# Patient Record
Sex: Male | Born: 1955 | Race: White | Hispanic: No | Smoking: Never smoker
Health system: Southern US, Community
[De-identification: ages and names within clinical notes are randomized; demographics above are authoritative.]

## PROBLEM LIST (undated history)

## (undated) ENCOUNTER — Ambulatory Visit: Discharge status: Absent without leave | Payer: BC Managed Care – PPO

## (undated) DIAGNOSIS — K219 Gastro-esophageal reflux disease without esophagitis: Secondary | ICD-10-CM

## (undated) DIAGNOSIS — E785 Hyperlipidemia, unspecified: Secondary | ICD-10-CM

## (undated) DIAGNOSIS — E119 Type 2 diabetes mellitus without complications: Secondary | ICD-10-CM

## (undated) DIAGNOSIS — I10 Essential (primary) hypertension: Secondary | ICD-10-CM

## (undated) DIAGNOSIS — R7303 Prediabetes: Secondary | ICD-10-CM

## (undated) DIAGNOSIS — D6851 Activated protein C resistance: Secondary | ICD-10-CM

## (undated) DIAGNOSIS — C801 Malignant (primary) neoplasm, unspecified: Secondary | ICD-10-CM

## (undated) DIAGNOSIS — M1990 Unspecified osteoarthritis, unspecified site: Secondary | ICD-10-CM

## (undated) DIAGNOSIS — G4733 Obstructive sleep apnea (adult) (pediatric): Secondary | ICD-10-CM

## (undated) DIAGNOSIS — G5600 Carpal tunnel syndrome, unspecified upper limb: Secondary | ICD-10-CM

## (undated) DIAGNOSIS — Z87442 Personal history of urinary calculi: Secondary | ICD-10-CM

## (undated) HISTORY — DX: Obstructive sleep apnea (adult) (pediatric): G47.33

## (undated) HISTORY — DX: Type 2 diabetes mellitus without complications: E11.9

## (undated) HISTORY — DX: Hyperlipidemia, unspecified: E78.5

## (undated) HISTORY — PX: HERNIA REPAIR: SHX51

## (undated) HISTORY — DX: Essential (primary) hypertension: I10

---

## 1994-09-27 HISTORY — PX: ANKLE SURGERY: SHX546

## 1997-10-07 ENCOUNTER — Encounter: Payer: Self-pay | Admitting: Pulmonary Disease

## 1997-11-12 ENCOUNTER — Encounter: Payer: Self-pay | Admitting: Pulmonary Disease

## 1997-12-26 ENCOUNTER — Encounter: Payer: Self-pay | Admitting: Pulmonary Disease

## 1998-01-27 ENCOUNTER — Encounter: Payer: Self-pay | Admitting: Pulmonary Disease

## 1998-01-27 ENCOUNTER — Ambulatory Visit: Admission: RE | Admit: 1998-01-27 | Discharge: 1998-01-27 | Payer: Self-pay | Admitting: Pulmonary Disease

## 2001-04-10 ENCOUNTER — Other Ambulatory Visit: Admission: RE | Admit: 2001-04-10 | Discharge: 2001-04-10 | Payer: Self-pay | Admitting: Dermatology

## 2004-02-19 ENCOUNTER — Ambulatory Visit (HOSPITAL_COMMUNITY): Admission: RE | Admit: 2004-02-19 | Discharge: 2004-02-19 | Payer: Self-pay | Admitting: General Surgery

## 2010-02-20 ENCOUNTER — Encounter: Payer: Self-pay | Admitting: Gastroenterology

## 2010-02-25 HISTORY — PX: COLONOSCOPY: SHX174

## 2010-03-06 ENCOUNTER — Ambulatory Visit (HOSPITAL_COMMUNITY): Admission: RE | Admit: 2010-03-06 | Discharge: 2010-03-06 | Payer: Self-pay | Admitting: Gastroenterology

## 2010-03-06 ENCOUNTER — Ambulatory Visit: Payer: Self-pay | Admitting: Gastroenterology

## 2010-04-22 ENCOUNTER — Ambulatory Visit: Payer: Self-pay | Admitting: Pulmonary Disease

## 2010-04-22 DIAGNOSIS — I10 Essential (primary) hypertension: Secondary | ICD-10-CM | POA: Insufficient documentation

## 2010-04-22 DIAGNOSIS — G4733 Obstructive sleep apnea (adult) (pediatric): Secondary | ICD-10-CM | POA: Insufficient documentation

## 2010-04-22 DIAGNOSIS — Z9989 Dependence on other enabling machines and devices: Secondary | ICD-10-CM

## 2010-04-22 DIAGNOSIS — E119 Type 2 diabetes mellitus without complications: Secondary | ICD-10-CM

## 2010-05-09 ENCOUNTER — Encounter: Payer: Self-pay | Admitting: Pulmonary Disease

## 2010-10-27 NOTE — Letter (Signed)
Summary: Internal Other Domingo Dimes  Internal Other Domingo Dimes   Imported By: Cloria Spring LPN 16/06/9603 54:09:81  _____________________________________________________________________  External Attachment:    Type:   Image     Comment:   External Document

## 2010-10-27 NOTE — Letter (Signed)
Summary: [facility]   Imported By: Sherian Rein 04/23/2010 07:24:31  _____________________________________________________________________  External Attachment:    Type:   Image     Comment:   External Document

## 2010-10-27 NOTE — Assessment & Plan Note (Signed)
Summary: consult for management of osa   Copy to:  Carylon Perches Primary Provider/Referring Provider:  Carylon Perches  CC:  Sleep Consult.  Former Pt of Dr. Shelle Iron.  Last seen 1999.Marland Kitchen  History of Present Illness: The pt is a 55y/o male who I have been asked to see for management of osa.  He was diagnosed with severe osa in 1999, with AHI of 73/hr and optimal cpap pressure of 11cm.  He was started on cpap successfully, but lost to f/u since 1999.  He has stayed on cpap since that time, and feels he is doing well with the device.  He is using the same machine, which is overdue for replacement, and his current mask is at least 55 yrs old.  He goes to bed at 10-11pm, and arises at 5:30am to start his day.  He feels rested upon arising.  He denies any breakthru snoring or apnea.  He has no daytime alertness issues, and no sleepiness with driving.  His weight is up 12 pounds since his last sleep study, and his epworth score is 8.  Preventive Screening-Counseling & Management  Alcohol-Tobacco     Smoking Status: never  Current Medications (verified): 1)  Pravastatin Sodium 20 Mg Tabs (Pravastatin Sodium) .... Take 1 Tablet By Mouth Once A Day 2)  Amlodipine Besy-Benazepril Hcl 5-20 Mg Caps (Amlodipine Besy-Benazepril Hcl) .... Take 1 Tablet By Mouth Once A Day 3)  Fish Oil .... Take 1 Tablet By Mouth Once A Day 4)  Aspirin Low Dose 81 Mg Tabs (Aspirin) .... Take 1 Tablet By Mouth Once A Day  Allergies (verified): No Known Drug Allergies  Past History:  Past Medical History:  DIABETES MELLITUS (ICD-250.00) OBSTRUCTIVE SLEEP APNEA (ICD-327.23)--AHI 73/hr in 1999 HYPERTENSION (ICD-401.9)    Past Surgical History: L ankle surgery  Family History: Reviewed history and no changes required. heart disease: mother clotting disorder: father rheumatism: sister, mother   Social History: Reviewed history and no changes required. Patient never smoked.  pt is married and lives with wife, Ethridge Sollenberger. Pt has children. pt works as a Academic librarian. Smoking Status:  never  Review of Systems  The patient denies shortness of breath with activity, shortness of breath at rest, productive cough, non-productive cough, coughing up blood, chest pain, irregular heartbeats, acid heartburn, indigestion, loss of appetite, weight change, abdominal pain, difficulty swallowing, sore throat, tooth/dental problems, headaches, nasal congestion/difficulty breathing through nose, sneezing, itching, ear ache, anxiety, depression, hand/feet swelling, joint stiffness or pain, rash, change in color of mucus, and fever.    Vital Signs:  Patient profile:   55 year old male Height:      69.5 inches Weight:      228.25 pounds O2 Sat:      97 % on Room air Temp:     98.1 degrees F oral Pulse rate:   68 / minute BP sitting:   122 / 62  (left arm) Cuff size:   regular  Vitals Entered By: Arman Filter LPN (April 22, 2010 9:24 AM)  O2 Flow:  Room air CC: Sleep Consult.  Former Pt of Dr. Shelle Iron.  Last seen 1999. Comments Medications reviewed with patient Arman Filter LPN  April 22, 2010 9:24 AM    Physical Exam  General:  ow male in nad Eyes:  PERRLA and EOMI.   Nose:  mild turbinate hypertrophy, but patent bilat Mouth:  significant elongation of soft palate and uvula Neck:  no jvd, tmg, LN Lungs:  clear to  auscultation, no wheezing or rhonchi Heart:  rrr, no mrg Abdomen:  soft and nontender, bs+ Extremities:  mild ankle edema, no cyanosis pulses intact distally Neurologic:  alert and oriented, does not appear sleepy, moves all 4.   Impression & Recommendations:  Problem # 1:   OBSTRUCTIVE SLEEP APNEA (ICD-327.23) the pt has a h/o severe osa, and has actually gained weight since his last sleep study.  He has remained compliant with cpap, and feels that he is doing well with his sleep and daytime alertness.  He is overdue for a new mask and cpap machine, and will arrange for replacement.   I have encouraged him to work aggressively on weight loss, and to followup with me in one year.  If he feels his symptoms are not being adequately controlled, wiould do an autotitration for 2 weeks to recalibrate his pressure.  Medications Added to Medication List This Visit: 1)  Pravastatin Sodium 20 Mg Tabs (Pravastatin sodium) .... Take 1 tablet by mouth once a day 2)  Amlodipine Besy-benazepril Hcl 5-20 Mg Caps (Amlodipine besy-benazepril hcl) .... Take 1 tablet by mouth once a day 3)  Fish Oil  .... Take 1 tablet by mouth once a day 4)  Aspirin Low Dose 81 Mg Tabs (Aspirin) .... Take 1 tablet by mouth once a day  Other Orders: Consultation Level IV (47829) DME Referral (DME)  Patient Instructions: 1)  will get you a new machine, mask, hoses 2)  work on weight loss 3)  followup with me in one year.

## 2010-10-27 NOTE — Letter (Signed)
Summary: [facility]  [facility]   Imported By: Lester Campo [place]ito 05/13/2010 10:21:01  _____________________________________________________________________  External Attachment:    Type:   Image     Comment:   External Document

## 2010-10-27 NOTE — Consult Note (Signed)
Summary: [facility]   Imported By: Sherian Rein 04/23/2010 07:23:01  _____________________________________________________________________  External Attachment:    Type:   Image     Comment:   External Document

## 2011-02-12 NOTE — H&P (Signed)
NAME:  Carl [name], Carl [name] NO.:  [id number]   MEDICAL RECORD NO.:  [id number]                  PATIENT TYPE:   LOCATION:                                       FACILITY:   PHYSICIAN:  Dalia Heading, M.D.               DATE OF BIRTH:  1956-02-22   DATE OF ADMISSION:  DATE OF DISCHARGE:                                HISTORY & PHYSICAL   CHIEF COMPLAINT:  Mass, right flank.   HISTORY OF PRESENT ILLNESS:  The patient is a 55 year old white male who was  referred for evaluation and treatment of a tender mass along his right  flank. It has been present for some time, but it is increasing in size and  causing discomfort. Pain is made worse with straining. No drainage has been  noted.   PAST MEDICAL HISTORY:  Hypertension.   PAST SURGICAL HISTORY:  Ankle surgery.   CURRENT MEDICATIONS:  Lotrel one tablet p.o. q.d.   ALLERGIES:  No known drug allergies.   REVIEW OF SYSTEMS:  Noncontributory.   PHYSICAL EXAMINATION:  GENERAL:  The patient is a well-developed, well-  nourished, white male in no acute distress. He is afebrile. Vital signs are  stable.  LUNGS:  Clear to auscultation with equal breath sounds bilaterally.  HEART:  Regular rate and rhythm without S3, S4, murmurs.  ABDOMEN:  Right flank examination reveals a right posterior flank 2 to 3 cm  ovoid mass over the costal margin. It is subcutaneous in nature. It is  mobile.   IMPRESSION:  Mass, right flank.   PLAN:  The patient is scheduled for excision of the mass, right flank on Feb 19, 2004. The risks and benefits of the procedure including bleeding  infection and recurrence of the mass were fully explained to the patient who  gave informed consent.     ___________________________________________                                         Dalia Heading, M.D.   MAJ/MEDQ  D:  02/06/2004  T:  02/06/2004  Job:  [id number]   cc:   Kingsley Callander. Ouida Sills, M.D.  [address]  [place]  [place]  [address]  Fax: [phone]

## 2011-05-10 ENCOUNTER — Encounter: Payer: Self-pay | Admitting: Pulmonary Disease

## 2011-05-11 ENCOUNTER — Encounter: Payer: Self-pay | Admitting: Pulmonary Disease

## 2011-05-11 ENCOUNTER — Ambulatory Visit (INDEPENDENT_AMBULATORY_CARE_PROVIDER_SITE_OTHER): Payer: BC Managed Care – PPO | Admitting: Pulmonary Disease

## 2011-05-11 VITALS — BP 126/70 | HR 63 | Temp 98.1°F | Ht 69.0 in | Wt 232.0 lb

## 2011-05-11 DIAGNOSIS — G4733 Obstructive sleep apnea (adult) (pediatric): Secondary | ICD-10-CM

## 2011-05-11 NOTE — Progress Notes (Signed)
  Subjective:    Patient ID: Carl [name], male    DOB: 05-28-1956, 55 y.o.   MRN: [id number]  HPI The patient comes in today for followup of his known sleep apnea.  He is wearing CPAP compliantly, and is having no issues with mask fit or pressure.  He is having difficulties with his current mask because it is old, and due for replacement.  He feels the CPAP is working quite well for him, with restorative sleep and excellent daytime alertness.  His wife denies any breakthrough snoring.   Review of Systems  Constitutional: Negative for fever and unexpected weight change.  HENT: Negative for ear pain, nosebleeds, congestion, sore throat, rhinorrhea, sneezing, trouble swallowing, dental problem, postnasal drip and sinus pressure.   Eyes: Negative for redness and itching.  Respiratory: Negative for cough, chest tightness, shortness of breath and wheezing.   Cardiovascular: Positive for leg swelling. Negative for palpitations.  Gastrointestinal: Negative for nausea and vomiting.  Genitourinary: Negative for dysuria.  Musculoskeletal: Negative for joint swelling.  Skin: Negative for rash.  Neurological: Negative for headaches.  Hematological: Does not bruise/bleed easily.  Psychiatric/Behavioral: Negative for dysphoric mood. The patient is not nervous/anxious.        Objective:   Physical Exam Overweight male in no acute distress No skin breakdown or pressure necrosis from CPAP mask Nose without purulence or discharge noted Lower extremities without edema, pulses intact distally Alert, does not appear sleepy, moves all 4 extremities.       Assessment & Plan:

## 2011-05-11 NOTE — Assessment & Plan Note (Signed)
The patient is doing well with his CPAP, with control of his symptomatology.  He does need a new CPAP mask, and we'll send an order to his durable medical equipment company for this.  I have asked him to continue on CPAP, keep up with supplies, and work aggressively on weight loss.  I will see him back in one year for a check.

## 2011-05-11 NOTE — Patient Instructions (Signed)
Will send an order to your dme to get you new mask Work on weight loss followup with me in one year.

## 2012-02-19 ENCOUNTER — Emergency Department (HOSPITAL_COMMUNITY)
Admission: EM | Admit: 2012-02-19 | Discharge: 2012-02-19 | Disposition: A | Discharge status: Home / Self Care | Payer: BC Managed Care – PPO | Attending: Emergency Medicine | Admitting: Emergency Medicine

## 2012-02-19 ENCOUNTER — Encounter (HOSPITAL_COMMUNITY): Payer: Self-pay | Admitting: *Deleted

## 2012-02-19 DIAGNOSIS — M7989 Other specified soft tissue disorders: Secondary | ICD-10-CM | POA: Insufficient documentation

## 2012-02-19 DIAGNOSIS — H9319 Tinnitus, unspecified ear: Secondary | ICD-10-CM | POA: Insufficient documentation

## 2012-02-19 DIAGNOSIS — M79643 Pain in unspecified hand: Secondary | ICD-10-CM

## 2012-02-19 DIAGNOSIS — E119 Type 2 diabetes mellitus without complications: Secondary | ICD-10-CM | POA: Insufficient documentation

## 2012-02-19 DIAGNOSIS — I10 Essential (primary) hypertension: Secondary | ICD-10-CM | POA: Insufficient documentation

## 2012-02-19 DIAGNOSIS — M79609 Pain in unspecified limb: Secondary | ICD-10-CM | POA: Insufficient documentation

## 2012-02-19 LAB — CBC
HCT: 38.1 % — ABNORMAL LOW (ref 39.0–52.0)
Hemoglobin: 13.1 g/dL (ref 13.0–17.0)
MCH: 28.1 pg (ref 26.0–34.0)
MCV: 81.8 fL (ref 78.0–100.0)
RBC: 4.66 MIL/uL (ref 4.22–5.81)

## 2012-02-19 LAB — SEDIMENTATION RATE: Sed Rate: 17 mm/hr — ABNORMAL HIGH (ref 0–16)

## 2012-02-19 LAB — COMPREHENSIVE METABOLIC PANEL
Alkaline Phosphatase: 69 U/L (ref 39–117)
BUN: 8 mg/dL (ref 6–23)
CO2: 25 mEq/L (ref 19–32)
Calcium: 8.9 mg/dL (ref 8.4–10.5)
GFR calc Af Amer: >90 mL/min (ref >90)
GFR calc non Af Amer: >90 mL/min (ref >90)
Glucose, Bld: 118 mg/dL — ABNORMAL HIGH (ref 70–99)
Total Protein: 6.9 g/dL (ref 6.0–8.3)

## 2012-02-19 LAB — DIFFERENTIAL
Eosinophils Absolute: 0.1 10*3/uL (ref 0.0–0.7)
Eosinophils Relative: 1 % (ref 0–5)
Lymphs Abs: 2.3 10*3/uL (ref 0.7–4.0)
Monocytes Relative: 9 % (ref 3–12)

## 2012-02-19 MED ORDER — IBUPROFEN 800 MG PO TABS
800.0000 mg | ORAL_TABLET | Freq: Once | ORAL | Status: AC
  Administered 2012-02-19: 800 mg via ORAL
  Filled 2012-02-19: qty 1

## 2012-02-19 MED ORDER — NAPROXEN 500 MG PO TABS: 500.0000 mg | ORAL_TABLET | Freq: Two times a day (BID) | ORAL | Status: AC

## 2012-02-19 NOTE — ED Provider Notes (Signed)
History  Scribed for Carl Lennert, MD, the patient was seen in room APA17/APA17. This chart was scribed by Carl [name]. The patient's care started at 10:44 AM    CSN: [id number]  Arrival date & time 02/19/12  5638   First MD Initiated Contact with Patient 02/19/12 1034      Chief Complaint  Patient presents with  . Tinnitus    Patient is a 56 y.o. male presenting with plugged ear sensation.  Ear Fullness This is a recurrent problem. The current episode started yesterday. The problem occurs constantly. The problem has not changed since onset.Pertinent negatives include no abdominal pain and no shortness of breath. The symptoms are aggravated by nothing. The symptoms are relieved by nothing. Treatments tried: prescribed augmentin. The treatment provided mild relief.   Carl [name] is a 56 y.o. male who presents to the Emergency Department complaining of bilateral tinnitus and unsteady gait that started yesterday.  Pt was seen by Dr. Ouida [name] five days ago with a sinus infection and was prescribed Augmentin.  He states that he was getting better and started feeling worse yesterday experiencing weakness and swelling in his hands bilaterally.  Pt reports that he has been bitten by a tick about one week ago.   Past Medical History  Diagnosis Date  . Diabetes mellitus   . OSA (obstructive sleep apnea)   . Hypertension     Past Surgical History  Procedure Date  . Ankle surgery     Left    Family History  Problem Relation Age of Onset  . Heart disease Mother   . Clotting disorder Father   . Rheum arthritis Sister   . Rheum arthritis Mother     History  Substance Use Topics  . Smoking status: Never Smoker   . Smokeless tobacco: Not on file  . Alcohol Use: No      Review of Systems  HENT: Positive for tinnitus.   Respiratory: Negative for shortness of breath.   Gastrointestinal: Negative for abdominal pain.  Musculoskeletal: Positive for joint swelling.    Neurological: Positive for weakness.  All other systems reviewed and are negative.    Allergies  Review of patient's allergies indicates no known allergies.  Home Medications   Current Outpatient Rx  Name Route Sig Dispense Refill  . AMLODIPINE BESY-BENAZEPRIL HCL 5-20 MG PO CAPS Oral Take 1 capsule by mouth 2 (two) times daily.     . ASPIRIN 81 MG PO TABS Oral Take 81 mg by mouth daily.      . OMEGA-3 FATTY ACIDS 1000 MG PO CAPS Oral Take 1 capsule by mouth daily.      Marland Kitchen PRAVASTATIN SODIUM 20 MG PO TABS Oral Take 20 mg by mouth daily.        BP 140/76  Pulse 81  Temp(Src) 99.7 F (37.6 C) (Oral)  Resp 20  Ht 5\' 9"  (1.753 m)  Wt 220 lb (99.791 kg)  BMI 32.49 kg/m2  SpO2 97%  Physical Exam  Nursing note and vitals reviewed. Constitutional: He is oriented to person, place, and time. He appears well-developed and well-nourished. No distress.  HENT:  Head: Normocephalic and atraumatic.  Eyes: EOM are normal. Pupils are equal, round, and reactive to light.  Neck: Neck supple. No tracheal deviation present.  Cardiovascular: Normal rate.   Pulmonary/Chest: Effort normal. No respiratory distress.  Abdominal: Soft. He exhibits no distension.  Musculoskeletal: Normal range of motion. He exhibits no edema.       Tenderness  and inflammation in the right first and second MCP joints.    Neurological: He is alert and oriented to person, place, and time. No sensory deficit.  Skin: Skin is warm and dry.  Psychiatric: He has a normal mood and affect. His behavior is normal.    ED Course  Procedures  DIAGNOSTIC STUDIES: Oxygen Saturation is 97% on room air, normal by my interpretation.    COORDINATION OF CARE:  10:51AM Ordered: CBC ; Differential ; Comprehensive metabolic panel ; Sedimentation rate ; B. burgdorfi antibodies   Labs Reviewed  CBC - Abnormal; Notable for the following:    WBC 11.4 (*)    HCT 38.1 (*)    All other components within normal limits  DIFFERENTIAL  - Abnormal; Notable for the following:    Neutro Abs 7.9 (*)    All other components within normal limits  COMPREHENSIVE METABOLIC PANEL - Abnormal; Notable for the following:    Sodium 134 (*)    Glucose, Bld 118 (*)    All other components within normal limits  SEDIMENTATION RATE - Abnormal; Notable for the following:    Sed Rate 17 (*)    All other components within normal limits  B. BURGDORFI ANTIBODIES   No results found.   No diagnosis found.    MDM   The chart was scribed for me under my direct supervision.  I personally performed the history, physical, and medical decision making and all procedures in the evaluation of this patient.Carl Lennert, MD 02/22/12 [phone]

## 2012-02-19 NOTE — ED Notes (Signed)
Family at bedside. Family asked about if the results were ready. Patient and family member were told 1 result is still pending the Doctor would be in as soon as the results were available.

## 2012-02-19 NOTE — Discharge Instructions (Signed)
Follow up with your md this week. °

## 2012-02-19 NOTE — ED Notes (Addendum)
Pt c/o aching all over, bilateral ringing in ears and unsteady gait since yesterday. Seen on Monday by Dr. Ouida Sills and diagnosed with an earache and sinus infection. Pt states that he started feeling better and then got worse again yesterday. Pt has weak hand grips and swelling to bilateral index fingers and thumbs. Bitten by a tick 1 week ago and had multiple other tick bites.

## 2012-02-19 NOTE — ED Notes (Signed)
Family at bedside. Patient states he does not need anything at this time. 

## 2014-03-13 NOTE — H&P (Signed)
  NTS SOAP Note  Vital Signs:  Vitals as of: 0/97/3532: Systolic 992: Diastolic 96: Heart Rate 71: Temp 98.40F: Height 29ft 9in: Weight 234Lbs 0 Ounces: BMI 34.56  BMI : 34.56 kg/m2  Subjective: This 58 Years 71 Months old Male presents for of an umbilical hernia. He has been present for some time but is increasing in size and causing discomfort. Made worse with straining.  Review of Symptoms:  Constitutional:unremarkable   Head:unremarkable    Eyes:unremarkable   Nose/Mouth/Throat:unremarkable Cardiovascular:  unremarkable   Respiratory:  cough Gastrointestinal:  unremarkable   Genitourinary:unremarkable       back Skin:unremarkable Hematolgic/Lymphatic:unremarkable     Allergic/Immunologic:unremarkable     Past Medical History:    Reviewed  Past Medical History  Surgical History: ankle surgery, kidney surgery Medical Problems: HTN, high cholesterol Allergies: amoxicillin Medications: amlodipine/benazepril, pravastatin, aspirin   Social History:Reviewed  Social History  Preferred Language: English Race:  White Ethnicity: Not Hispanic / Latino Age: 58 Years 10 Months Marital Status:  M Alcohol: no Recreational drug(s): no   Smoking Status: Never smoker reviewed on 02/22/2014 Functional Status reviewed on 02/22/2014 ------------------------------------------------ Bathing: Normal Cooking: Normal Dressing: Normal Driving: Normal Eating: Normal Managing Meds: Normal Oral Care: Normal Shopping: Normal Toileting: Normal Transferring: Normal Walking: Normal Cognitive Status reviewed on 02/22/2014 ------------------------------------------------ Attention: Normal Decision Making: Normal Language: Normal Memory: Normal Motor: Normal Perception: Normal Problem Solving: Normal Visual and Spatial: Normal   Family History:  Reviewed  Family Health History Mother, Living; Heart disease;  Father, Deceased;  Healthy;     Objective Information: General:  Well appearing, well nourished in no distress. Heart:  RRR, no murmur or gallop.  Normal S1, S2.  No S3, S4.  Lungs:    CTA bilaterally, no wheezes, rhonchi, rales.  Breathing unlabored. Abdomen:Soft, NT/ND, no HSM, no masses.large reducible umbilical hernia.  Assessment:umbilical hernia  Diagnoses: 426.8 Umbilical hernia (Umbilical hernia without obstruction or gangrene)  Procedures: 34196 - OFFICE OUTPATIENT NEW 30 MINUTES    Plan:scheduled for umbilical herniorrhaphy with mesh on 04/10/2014.    Patient Education:Alternative treatments to surgery were discussed with patient (and family).  Risks and benefits  of procedure including bleeding, infection, mesh use, and a possibility of recurrence of the hernia were fully explained to the patient (and family) who gave informed consent. Patient/family questions were addressed.  Follow-up:Pending Surgery

## 2014-04-01 ENCOUNTER — Encounter (HOSPITAL_COMMUNITY): Payer: Self-pay | Admitting: Pharmacy Technician

## 2014-04-03 NOTE — Patient Instructions (Signed)
Alaster Asfaw Vermeer  04/03/2014   Your procedure is scheduled on:   04/10/2014  Report to [facility] at  110  AM.  Call this number if you have problems the morning of surgery: [phone]   Remember:   Do not eat food or drink liquids after midnight.   Take these medicines the morning of surgery with A SIP OF WATER: amlodipine   Do not wear jewelry, make-up or nail polish.  Do not wear lotions, powders, or perfumes.   Do not shave 48 hours prior to surgery. Men may shave face and neck.  Do not bring valuables to the hospital.  [facility] is not responsible for any belongings or valuables.               Contacts, dentures or bridgework may not be worn into surgery.  Leave suitcase in the car. After surgery it may be brought to your room.  For patients admitted to the hospital, discharge time is determined by your treatment team.               Patients discharged the day of surgery will not be allowed to drive home.  Name and phone number of your driver: family  Special Instructions: Shower using CHG 2 nights before surgery and the night before surgery.  If you shower the day of surgery use CHG.  Use special wash - you have one bottle of CHG for all showers.  You should use approximately 1/3 of the bottle for each shower.   Please read over the following fact sheets that you were given: Pain Booklet, Coughing and Deep Breathing, Surgical Site Infection Prevention, Anesthesia Post-op Instructions and Care and Recovery After Surgery Hernia A hernia happens when an organ inside your body pushes out through a weak spot in your belly (abdominal) wall. Most hernias get worse over time. They can often be pushed back into place (reduced). Surgery may be needed to repair hernias that cannot be pushed into place. HOME CARE  Keep doing normal activities.  Avoid lifting more than 10 pounds (4.5 kilograms).  Cough gently and avoid straining. Over time, these things will:  Increase your  hernia size.  Irritate your hernia.  Break down hernia repairs.  Stop smoking.  Do not wear anything tight over your hernia. Do not keep the hernia in with an outside bandage.  Eat food that is high in fiber (fruit, vegetables, whole grains).  Drink enough fluids to keep your pee (urine) clear or pale yellow.  Take medicines to make your poop soft (stool softeners) if you cannot poop (constipated). GET HELP RIGHT AWAY IF:   You have a fever.  You have belly pain that gets worse.  You feel sick to your stomach (nauseous) and throw up (vomit).  Your skin starts to bulge out.  Your hernia turns a different color, feels hard, or is tender.  You have increased pain or puffiness (swelling) around the hernia.  You poop more or less often.  Your poop does not look the way normally does.  You have watery poop (diarrhea).  You cannot push the hernia back in place by applying gentle pressure while lying down. MAKE SURE YOU:   Understand these instructions.  Will watch your condition.  Will get help right away if you are not doing well or get worse. Document Released: 03/03/2010 Document Revised: 12/06/2011 Document Reviewed: 03/03/2010 [facility] Patient Information 2015 [place], [place]. This information is not intended to replace  advice given to you by your health care provider. Make sure you discuss any questions you have with your health care provider. PATIENT INSTRUCTIONS POST-ANESTHESIA  IMMEDIATELY FOLLOWING SURGERY:  Do not drive or operate machinery for the first twenty four hours after surgery.  Do not make any important decisions for twenty four hours after surgery or while taking narcotic pain medications or sedatives.  If you develop intractable nausea and vomiting or a severe headache please notify your doctor immediately.  FOLLOW-UP:  Please make an appointment with your surgeon as instructed. You do not need to follow up with anesthesia unless specifically  instructed to do so.  WOUND CARE INSTRUCTIONS (if applicable):  Keep a dry clean dressing on the anesthesia/puncture wound site if there is drainage.  Once the wound has quit draining you may leave it open to air.  Generally you should leave the bandage intact for twenty four hours unless there is drainage.  If the epidural site drains for more than 36-48 hours please call the anesthesia department.  QUESTIONS?:  Please feel free to call your physician or the hospital operator if you have any questions, and they will be happy to assist you.

## 2014-04-04 ENCOUNTER — Other Ambulatory Visit: Payer: Self-pay

## 2014-04-04 ENCOUNTER — Encounter (HOSPITAL_COMMUNITY)
Admission: RE | Admit: 2014-04-04 | Discharge: 2014-04-04 | Disposition: A | Discharge status: Home / Self Care | Payer: BC Managed Care – PPO | Source: Ambulatory Visit | Attending: General Surgery | Admitting: General Surgery

## 2014-04-04 ENCOUNTER — Encounter (HOSPITAL_COMMUNITY): Payer: Self-pay

## 2014-04-04 DIAGNOSIS — Z01812 Encounter for preprocedural laboratory examination: Secondary | ICD-10-CM | POA: Insufficient documentation

## 2014-04-04 HISTORY — DX: Activated protein C resistance: D68.51

## 2014-04-04 LAB — CBC WITH DIFFERENTIAL/PLATELET
Basophils Absolute: 0 10*3/uL (ref 0.0–0.1)
Basophils Relative: 0 % (ref 0–1)
Eosinophils Absolute: 0.2 10*3/uL (ref 0.0–0.7)
Eosinophils Relative: 4 % (ref 0–5)
HEMATOCRIT: 39.4 % (ref 39.0–52.0)
HEMOGLOBIN: 13.4 g/dL (ref 13.0–17.0)
LYMPHS PCT: 41 % (ref 12–46)
Lymphs Abs: 2.2 10*3/uL (ref 0.7–4.0)
MCH: 28.6 pg (ref 26.0–34.0)
MCHC: 34 g/dL (ref 30.0–36.0)
MCV: 84.2 fL (ref 78.0–100.0)
MONO ABS: 0.7 10*3/uL (ref 0.1–1.0)
MONOS PCT: 13 % — AB (ref 3–12)
NEUTROS ABS: 2.2 10*3/uL (ref 1.7–7.7)
Neutrophils Relative %: 42 % — ABNORMAL LOW (ref 43–77)
Platelets: 222 10*3/uL (ref 150–400)
RBC: 4.68 MIL/uL (ref 4.22–5.81)
RDW: 12.8 % (ref 11.5–15.5)
WBC: 5.3 10*3/uL (ref 4.0–10.5)

## 2014-04-04 LAB — BASIC METABOLIC PANEL
ANION GAP: 13 (ref 5–15)
BUN: 9 mg/dL (ref 6–23)
CHLORIDE: 102 meq/L (ref 96–112)
CO2: 24 meq/L (ref 19–32)
CREATININE: 0.57 mg/dL (ref 0.50–1.35)
Calcium: 8.8 mg/dL (ref 8.4–10.5)
GFR calc Af Amer: >90 mL/min (ref >90)
GFR calc non Af Amer: >90 mL/min (ref >90)
GLUCOSE: 133 mg/dL — AB (ref 70–99)
Potassium: 4 mEq/L (ref 3.7–5.3)
Sodium: 139 mEq/L (ref 137–147)

## 2014-04-04 NOTE — Pre-Procedure Instructions (Signed)
Patient given instructions with web site and code to sign up for "My Chart"  

## 2014-04-09 NOTE — OR Nursing (Signed)
Dr. Patsey Berthold notified of abnormal EKG and medical history of Factor 5

## 2014-04-10 ENCOUNTER — Ambulatory Visit (HOSPITAL_COMMUNITY): Payer: BC Managed Care – PPO | Admitting: Anesthesiology

## 2014-04-10 ENCOUNTER — Encounter (HOSPITAL_COMMUNITY): Payer: BC Managed Care – PPO | Admitting: Anesthesiology

## 2014-04-10 ENCOUNTER — Ambulatory Visit (HOSPITAL_COMMUNITY)
Admission: RE | Admit: 2014-04-10 | Discharge: 2014-04-10 | Disposition: A | Discharge status: Home / Self Care | Payer: BC Managed Care – PPO | Source: Ambulatory Visit | Attending: General Surgery | Admitting: General Surgery

## 2014-04-10 ENCOUNTER — Encounter (HOSPITAL_COMMUNITY): Payer: Self-pay

## 2014-04-10 ENCOUNTER — Encounter (HOSPITAL_COMMUNITY)
Admission: RE | Disposition: A | Discharge status: Home / Self Care | Payer: Self-pay | Source: Ambulatory Visit | Attending: General Surgery

## 2014-04-10 DIAGNOSIS — I10 Essential (primary) hypertension: Secondary | ICD-10-CM | POA: Insufficient documentation

## 2014-04-10 DIAGNOSIS — Z7982 Long term (current) use of aspirin: Secondary | ICD-10-CM | POA: Insufficient documentation

## 2014-04-10 DIAGNOSIS — K429 Umbilical hernia without obstruction or gangrene: Secondary | ICD-10-CM | POA: Insufficient documentation

## 2014-04-10 DIAGNOSIS — Z79899 Other long term (current) drug therapy: Secondary | ICD-10-CM | POA: Insufficient documentation

## 2014-04-10 DIAGNOSIS — E7800 Pure hypercholesterolemia, unspecified: Secondary | ICD-10-CM | POA: Insufficient documentation

## 2014-04-10 HISTORY — PX: INSERTION OF MESH: SHX5868

## 2014-04-10 HISTORY — PX: UMBILICAL HERNIA REPAIR: SHX196

## 2014-04-10 LAB — GLUCOSE, CAPILLARY
Glucose-Capillary: 112 mg/dL — ABNORMAL HIGH (ref 70–99)
Glucose-Capillary: 117 mg/dL — ABNORMAL HIGH (ref 70–99)

## 2014-04-10 SURGERY — REPAIR, HERNIA, UMBILICAL, ADULT
Anesthesia: General | Site: Abdomen

## 2014-04-10 MED ORDER — FENTANYL CITRATE 0.05 MG/ML IJ SOLN: 25.0000 ug | INTRAMUSCULAR | Status: DC | PRN

## 2014-04-10 MED ORDER — BUPIVACAINE HCL (PF) 0.5 % IJ SOLN
INTRAMUSCULAR | Status: AC
  Filled 2014-04-10: qty 30

## 2014-04-10 MED ORDER — GLYCOPYRROLATE 0.2 MG/ML IJ SOLN
INTRAMUSCULAR | Status: AC
  Filled 2014-04-10: qty 1

## 2014-04-10 MED ORDER — GLYCOPYRROLATE 0.2 MG/ML IJ SOLN
0.2000 mg | Freq: Once | INTRAMUSCULAR | Status: AC
  Administered 2014-04-10: 0.2 mg via INTRAVENOUS

## 2014-04-10 MED ORDER — POVIDONE-IODINE 10 % EX OINT
TOPICAL_OINTMENT | CUTANEOUS | Status: AC
  Filled 2014-04-10: qty 1

## 2014-04-10 MED ORDER — LIDOCAINE HCL (CARDIAC) 20 MG/ML IV SOLN
INTRAVENOUS | Status: DC | PRN
  Administered 2014-04-10: 50 mg via INTRAVENOUS

## 2014-04-10 MED ORDER — ARTIFICIAL TEARS OP OINT
TOPICAL_OINTMENT | OPHTHALMIC | Status: AC
  Filled 2014-04-10: qty 3.5

## 2014-04-10 MED ORDER — VANCOMYCIN HCL IN DEXTROSE 1-5 GM/200ML-% IV SOLN
INTRAVENOUS | Status: AC
  Filled 2014-04-10: qty 200

## 2014-04-10 MED ORDER — FENTANYL CITRATE 0.05 MG/ML IJ SOLN
INTRAMUSCULAR | Status: AC
  Filled 2014-04-10: qty 5

## 2014-04-10 MED ORDER — FENTANYL CITRATE 0.05 MG/ML IJ SOLN
25.0000 ug | INTRAMUSCULAR | Status: AC
  Administered 2014-04-10 (×2): 25 ug via INTRAVENOUS

## 2014-04-10 MED ORDER — ONDANSETRON HCL 4 MG/2ML IJ SOLN: 4.0000 mg | Freq: Once | INTRAMUSCULAR | Status: DC | PRN

## 2014-04-10 MED ORDER — LACTATED RINGERS IV SOLN
INTRAVENOUS | Status: DC | PRN
  Administered 2014-04-10 (×2): via INTRAVENOUS

## 2014-04-10 MED ORDER — OXYCODONE-ACETAMINOPHEN 7.5-325 MG PO TABS: 1.0000 | ORAL_TABLET | ORAL | Status: DC | PRN

## 2014-04-10 MED ORDER — 0.9 % SODIUM CHLORIDE (POUR BTL) OPTIME
TOPICAL | Status: DC | PRN
  Administered 2014-04-10: 1000 mL

## 2014-04-10 MED ORDER — LACTATED RINGERS IV SOLN
INTRAVENOUS | Status: DC
  Administered 2014-04-10: 07:00:00 via INTRAVENOUS

## 2014-04-10 MED ORDER — KETOROLAC TROMETHAMINE 30 MG/ML IJ SOLN
INTRAMUSCULAR | Status: AC
  Filled 2014-04-10: qty 1

## 2014-04-10 MED ORDER — MIDAZOLAM HCL 2 MG/2ML IJ SOLN
1.0000 mg | INTRAMUSCULAR | Status: DC | PRN
  Administered 2014-04-10: 2 mg via INTRAVENOUS

## 2014-04-10 MED ORDER — KETOROLAC TROMETHAMINE 30 MG/ML IJ SOLN
30.0000 mg | Freq: Once | INTRAMUSCULAR | Status: AC
  Administered 2014-04-10: 30 mg via INTRAVENOUS

## 2014-04-10 MED ORDER — MIDAZOLAM HCL 2 MG/2ML IJ SOLN
INTRAMUSCULAR | Status: AC
  Filled 2014-04-10: qty 2

## 2014-04-10 MED ORDER — SODIUM CHLORIDE 0.9 % IJ SOLN
INTRAMUSCULAR | Status: AC
  Filled 2014-04-10: qty 10

## 2014-04-10 MED ORDER — PROPOFOL 10 MG/ML IV EMUL
INTRAVENOUS | Status: AC
  Filled 2014-04-10: qty 20

## 2014-04-10 MED ORDER — ONDANSETRON HCL 4 MG/2ML IJ SOLN
INTRAMUSCULAR | Status: AC
  Filled 2014-04-10: qty 2

## 2014-04-10 MED ORDER — CHLORHEXIDINE GLUCONATE 4 % EX LIQD: 1.0000 "application " | Freq: Once | CUTANEOUS | Status: DC

## 2014-04-10 MED ORDER — BUPIVACAINE HCL (PF) 0.5 % IJ SOLN
INTRAMUSCULAR | Status: DC | PRN
  Administered 2014-04-10: 10 mL

## 2014-04-10 MED ORDER — EPHEDRINE SULFATE 50 MG/ML IJ SOLN
INTRAMUSCULAR | Status: AC
  Filled 2014-04-10: qty 1

## 2014-04-10 MED ORDER — FENTANYL CITRATE 0.05 MG/ML IJ SOLN
INTRAMUSCULAR | Status: DC | PRN
  Administered 2014-04-10 (×3): 50 ug via INTRAVENOUS
  Administered 2014-04-10: 100 ug via INTRAVENOUS

## 2014-04-10 MED ORDER — FENTANYL CITRATE 0.05 MG/ML IJ SOLN
INTRAMUSCULAR | Status: AC
  Filled 2014-04-10: qty 2

## 2014-04-10 MED ORDER — POVIDONE-IODINE 10 % OINT PACKET
TOPICAL_OINTMENT | CUTANEOUS | Status: DC | PRN
  Administered 2014-04-10: 1 via TOPICAL

## 2014-04-10 MED ORDER — VANCOMYCIN HCL IN DEXTROSE 1-5 GM/200ML-% IV SOLN
1000.0000 mg | INTRAVENOUS | Status: AC
  Administered 2014-04-10: 1000 mg via INTRAVENOUS

## 2014-04-10 MED ORDER — SUCCINYLCHOLINE CHLORIDE 20 MG/ML IJ SOLN
INTRAMUSCULAR | Status: AC
  Filled 2014-04-10: qty 1

## 2014-04-10 MED ORDER — ONDANSETRON HCL 4 MG/2ML IJ SOLN
4.0000 mg | Freq: Once | INTRAMUSCULAR | Status: AC
  Administered 2014-04-10: 4 mg via INTRAVENOUS

## 2014-04-10 MED ORDER — PROPOFOL 10 MG/ML IV BOLUS
INTRAVENOUS | Status: DC | PRN
  Administered 2014-04-10: 150 mg via INTRAVENOUS

## 2014-04-10 MED ORDER — LIDOCAINE HCL (PF) 1 % IJ SOLN
INTRAMUSCULAR | Status: AC
  Filled 2014-04-10: qty 5

## 2014-04-10 SURGICAL SUPPLY — 40 items
BAG HAMPER (MISCELLANEOUS) ×4 IMPLANT
BLADE 11 SAFETY STRL DISP (BLADE) ×4 IMPLANT
BLADE SURG 15 STRL LF DISP TIS (BLADE) IMPLANT
BLADE SURG 15 STRL SS (BLADE) ×4
CLOTH BEACON ORANGE TIMEOUT ST (SAFETY) ×4 IMPLANT
COVER LIGHT HANDLE STERIS (MISCELLANEOUS) ×8 IMPLANT
DECANTER SPIKE VIAL GLASS SM (MISCELLANEOUS) ×4 IMPLANT
DURAPREP 26ML APPLICATOR (WOUND CARE) ×4 IMPLANT
ELECT REM PT RETURN 9FT ADLT (ELECTROSURGICAL) ×4
ELECTRODE REM PT RTRN 9FT ADLT (ELECTROSURGICAL) ×2 IMPLANT
GLOVE BIO SURGEON STRL SZ 6.5 (GLOVE) ×1 IMPLANT
GLOVE BIO SURGEONS STRL SZ 6.5 (GLOVE) ×1
GLOVE BIOGEL PI IND STRL 6.5 (GLOVE) ×2 IMPLANT
GLOVE BIOGEL PI IND STRL 7.0 (GLOVE) IMPLANT
GLOVE BIOGEL PI IND STRL 7.5 (GLOVE) IMPLANT
GLOVE BIOGEL PI INDICATOR 6.5 (GLOVE) ×2
GLOVE BIOGEL PI INDICATOR 7.0 (GLOVE) ×2
GLOVE BIOGEL PI INDICATOR 7.5 (GLOVE) ×2
GLOVE ECLIPSE 6.5 STRL STRAW (GLOVE) ×2 IMPLANT
GLOVE ECLIPSE 7.0 STRL STRAW (GLOVE) ×3 IMPLANT
GLOVE SURG SS PI 7.5 STRL IVOR (GLOVE) ×8 IMPLANT
GOWN STRL REUS W/TWL LRG LVL3 (GOWN DISPOSABLE) ×12 IMPLANT
INST SET MINOR GENERAL (KITS) ×4 IMPLANT
KIT ROOM TURNOVER APOR (KITS) ×4 IMPLANT
MANIFOLD NEPTUNE II (INSTRUMENTS) ×4 IMPLANT
MESH VENTRALEX ST 1-7/10 CRC S (Mesh General) ×4 IMPLANT
NS IRRIG 1000ML POUR BTL (IV SOLUTION) ×4 IMPLANT
PACK MINOR (CUSTOM PROCEDURE TRAY) ×4 IMPLANT
PAD ARMBOARD 7.5X6 YLW CONV (MISCELLANEOUS) ×4 IMPLANT
SET BASIN LINEN APH (SET/KITS/TRAYS/PACK) ×4 IMPLANT
SPONGE GAUZE 2X2 8PLY STER LF (GAUZE/BANDAGES/DRESSINGS) ×1
SPONGE GAUZE 2X2 8PLY STRL LF (GAUZE/BANDAGES/DRESSINGS) ×5 IMPLANT
STAPLER VISISTAT (STAPLE) ×4 IMPLANT
SUT ETHIBOND NAB MO 7 #0 18IN (SUTURE) ×4 IMPLANT
SUT VIC AB 2-0 CT2 27 (SUTURE) ×4 IMPLANT
SUT VIC AB 3-0 SH 27 (SUTURE) ×4
SUT VIC AB 3-0 SH 27X BRD (SUTURE) ×2 IMPLANT
SYRINGE CONTROL L 12CC (SYRINGE) ×4 IMPLANT
SYRINGE CONTROL LL 12CC (SYRINGE) ×2 IMPLANT
TAPE CLOTH SURG 4X10 WHT LF (GAUZE/BANDAGES/DRESSINGS) ×2 IMPLANT

## 2014-04-10 NOTE — Anesthesia Postprocedure Evaluation (Signed)
  Anesthesia Post-op Note  Patient: Carl [name]  Procedure(s) Performed: Procedure(s): HERNIA REPAIR UMBILICAL ADULT (N/A) INSERTION OF MESH (N/A)  Patient Location: PACU  Anesthesia Type:General  Level of Consciousness: awake, alert , oriented and patient cooperative  Airway and Oxygen Therapy: Patient Spontanous Breathing and Patient connected to face mask oxygen  Post-op Pain: mild  Post-op Assessment: Post-op Vital signs reviewed, Patient's Cardiovascular Status Stable, Respiratory Function Stable, Patent Airway, No signs of Nausea or vomiting and Pain level controlled  Post-op Vital Signs: Reviewed and stable  Last Vitals:  Filed Vitals:   04/10/14 0817  BP: 121/72  Pulse: 77  Temp: 36.8 C  Resp: 18    Complications: No apparent anesthesia complications

## 2014-04-10 NOTE — Interval H&P Note (Signed)
History and Physical Interval Note:  04/10/2014 7:28 AM  Carl [name]  has presented today for surgery, with the diagnosis of umbilical hernia  The various methods of treatment have been discussed with the patient and family. After consideration of risks, benefits and other options for treatment, the patient has consented to  Procedure(s): HERNIA REPAIR UMBILICAL ADULT (N/A) as a surgical intervention .  The patient's history has been reviewed, patient examined, no change in status, stable for surgery.  I have reviewed the patient's chart and labs.  Questions were answered to the patient's satisfaction.     Aviva Signs A

## 2014-04-10 NOTE — Transfer of Care (Signed)
Immediate Anesthesia Transfer of Care Note  Patient: Carl [name]  Procedure(s) Performed: Procedure(s): HERNIA REPAIR UMBILICAL ADULT (N/A) INSERTION OF MESH (N/A)  Patient Location: PACU  Anesthesia Type:General  Level of Consciousness: awake, alert , oriented and patient cooperative  Airway & Oxygen Therapy: Patient Spontanous Breathing and Patient connected to face mask oxygen  Post-op Assessment: Report given to PACU RN and Post -op Vital signs reviewed and stable  Post vital signs: Reviewed and stable  Complications: No apparent anesthesia complications

## 2014-04-10 NOTE — Anesthesia Procedure Notes (Signed)
Procedure Name: LMA Insertion Date/Time: 04/10/2014 7:39 AM Performed by: Andree Elk, Kamren Heintzelman A Pre-anesthesia Checklist: Patient identified, Timeout performed, Emergency Drugs available, Suction available and Patient being monitored Patient Re-evaluated:Patient Re-evaluated prior to inductionOxygen Delivery Method: Circle system utilized Preoxygenation: Pre-oxygenation with 100% oxygen Intubation Type: IV induction Ventilation: Mask ventilation without difficulty LMA: LMA inserted LMA Size: 5.0 Number of attempts: 1 Placement Confirmation: positive ETCO2 and breath sounds checked- equal and bilateral Tube secured with: Tape Dental Injury: Teeth and Oropharynx as per pre-operative assessment

## 2014-04-10 NOTE — Anesthesia Preprocedure Evaluation (Signed)
Anesthesia Evaluation  Patient identified by MRN, date of birth, ID band Patient awake    Reviewed: Allergy & Precautions, H&P , NPO status , Patient's Chart, lab work & pertinent test results  Airway Mallampati: II TM Distance: >3 FB     Dental  (+) Teeth Intact   Pulmonary sleep apnea and Continuous Positive Airway Pressure Ventilation ,  breath sounds clear to auscultation        Cardiovascular hypertension, Pt. on medications Rhythm:Regular Rate:Normal     Neuro/Psych    GI/Hepatic negative GI ROS,   Endo/Other  diabetes, Well Controlled, Type 2  Renal/GU      Musculoskeletal   Abdominal   Peds  Hematology  (+) Blood dyscrasia (Factor V mutation), ,   Anesthesia Other Findings   Reproductive/Obstetrics                           Anesthesia Physical Anesthesia Plan  ASA: II  Anesthesia Plan: General   Post-op Pain Management:    Induction: Intravenous  Airway Management Planned: LMA  Additional Equipment:   Intra-op Plan:   Post-operative Plan: Extubation in OR  Informed Consent: I have reviewed the patients History and Physical, chart, labs and discussed the procedure including the risks, benefits and alternatives for the proposed anesthesia with the patient or authorized representative who has indicated his/her understanding and acceptance.     Plan Discussed with:   Anesthesia Plan Comments:         Anesthesia Quick Evaluation

## 2014-04-10 NOTE — Op Note (Signed)
Patient:  Carl [name]  DOB:  11-01-1955  MRN:  [id number]   Preop Diagnosis:  Umbilical hernia  Postop Diagnosis:  Same  Procedure:  Umbilical herniorrhaphy with mesh  Surgeon:  Aviva Signs, M.D.  Anes:  General  Indications:  Patient is a 58 year old white male who presents with a symptomatic umbilical hernia. The risks and benefits of the procedure including bleeding, infection, and recurrence of the hernia were fully explained to the patient, who gave informed consent.  Procedure note:  The patient is placed the supine position. After general anesthesia was administered, the abdomen was prepped and draped using usual sterile technique with DuraPrep. Surgical site confirmation was performed.  An infraumbilical incision was made down to the fascia. The umbilicus was freed away from the underlying hernia sac. The hernia sac was entered into and omentum was noted be within it. This was reduced without difficulty. A 4.7 cm ventralax mesh patch was then placed and secured to the abdominal wall with its tabs using 0 Ethibond interrupted suture. The overlying muscle was then reapproximated transversely using 0 Ethibond interrupted sutures. The umbilicus was secured back to the fascia using a 2-0 Vicryl interrupted suture. The subcutaneous tissue was reapproximated using 3-0 Vicryl interrupted suture. The skin was closed using staples. 0.5% Sensorcaine was instilled the surrounding wound. Betadine ointment and dry sterile dressings were applied.  All tape and needle counts were correct at the end of the procedure. Patient was awakened and transferred to PACU in stable condition.  Complications:  None  EBL:  Minimal  Specimen:  None

## 2014-04-10 NOTE — Discharge Instructions (Signed)
Umbilical Herniorrhaphy Care After Refer to this sheet in the next few weeks. These instructions provide you with information on caring for yourself after your procedure. Your caregiver may also give you more specific instructions. Your treatment has been planned according to current medical practices, but problems sometimes occur. Call your caregiver if you have any problems or questions after your procedure. HOME CARE INSTRUCTIONS  If you are given antibiotic medicine, take it as directed. Finish it even if you start to feel better.  Only take over-the-counter or prescription medicines for pain, fever, or discomfort as directed by your caregiver. Do not take aspirin. It can cause bleeding.  Do not get your surgical cut (incision) area wet unless your caregiver says it is okay.  Avoid lifting objects heavier than 10 lb (4.5 kg) for 8 weeks after surgery.  Avoid sexual activity for 5 weeks after surgery or as directed by your caregiver.  Do not drive while taking prescription pain medicine.  You may return to your other normal, daily activities after 3 days or as directed by your caregiver. SEEK MEDICAL CARE IF:  You notice blood or fluid leaking from the surgical site.  Your incision area becomes red or swollen.  Your pain at the surgical site becomes worse or is not relieved by medicine.  You have problems urinating.  You feel nauseous or vomit more than 2 days after surgery.  You notice the bulge in your abdomen returns after the procedure. SEEK IMMEDIATE MEDICAL CARE IF:  You have a fever.  You have nausea or vomiting that will not stop. MAKE SURE YOU:  Understand these instructions.  Will watch your condition.  Will get help right away if you are not doing well or get worse. Document Released: 03/14/2012 Document Reviewed: 03/14/2012 [facility] Patient Information 2015 [place]. This information is not intended to replace advice given to you by your health care  provider. Make sure you discuss any questions you have with your health care provider.

## 2014-04-10 NOTE — Addendum Note (Signed)
Addendum created 04/10/14 7209 by Mickel Baas, CRNA   Modules edited: Charges VN

## 2014-04-12 ENCOUNTER — Encounter (HOSPITAL_COMMUNITY): Payer: Self-pay | Admitting: General Surgery

## 2015-02-06 ENCOUNTER — Encounter: Payer: Self-pay | Admitting: Gastroenterology

## 2015-04-04 ENCOUNTER — Encounter (HOSPITAL_BASED_OUTPATIENT_CLINIC_OR_DEPARTMENT_OTHER): Payer: Self-pay | Admitting: *Deleted

## 2015-04-04 NOTE — Progress Notes (Signed)
Patient lives in [place] and will go to [facility] for labs and EKG. Hx OSA, wears CPAP nightly and he will bring dos.

## 2015-04-07 ENCOUNTER — Encounter (HOSPITAL_COMMUNITY)
Admission: RE | Admit: 2015-04-07 | Discharge: 2015-04-07 | Disposition: A | Discharge status: Home / Self Care | Payer: BLUE CROSS/BLUE SHIELD | Source: Ambulatory Visit | Attending: Orthopedic Surgery | Admitting: Orthopedic Surgery

## 2015-04-07 ENCOUNTER — Other Ambulatory Visit: Payer: Self-pay

## 2015-04-07 DIAGNOSIS — R200 Anesthesia of skin: Secondary | ICD-10-CM | POA: Diagnosis present

## 2015-04-07 DIAGNOSIS — G5601 Carpal tunnel syndrome, right upper limb: Secondary | ICD-10-CM | POA: Diagnosis not present

## 2015-04-07 DIAGNOSIS — M79642 Pain in left hand: Secondary | ICD-10-CM | POA: Diagnosis present

## 2015-04-07 DIAGNOSIS — D6851 Activated protein C resistance: Secondary | ICD-10-CM | POA: Diagnosis not present

## 2015-04-07 DIAGNOSIS — E785 Hyperlipidemia, unspecified: Secondary | ICD-10-CM | POA: Diagnosis not present

## 2015-04-07 DIAGNOSIS — G4733 Obstructive sleep apnea (adult) (pediatric): Secondary | ICD-10-CM | POA: Diagnosis not present

## 2015-04-07 DIAGNOSIS — Z7982 Long term (current) use of aspirin: Secondary | ICD-10-CM | POA: Diagnosis not present

## 2015-04-07 DIAGNOSIS — Z79899 Other long term (current) drug therapy: Secondary | ICD-10-CM | POA: Diagnosis not present

## 2015-04-07 DIAGNOSIS — I10 Essential (primary) hypertension: Secondary | ICD-10-CM | POA: Diagnosis not present

## 2015-04-07 DIAGNOSIS — M79641 Pain in right hand: Secondary | ICD-10-CM | POA: Diagnosis present

## 2015-04-07 DIAGNOSIS — Z6833 Body mass index (BMI) 33.0-33.9, adult: Secondary | ICD-10-CM | POA: Diagnosis not present

## 2015-04-07 DIAGNOSIS — Z9989 Dependence on other enabling machines and devices: Secondary | ICD-10-CM | POA: Diagnosis not present

## 2015-04-07 DIAGNOSIS — E119 Type 2 diabetes mellitus without complications: Secondary | ICD-10-CM | POA: Diagnosis not present

## 2015-04-07 DIAGNOSIS — G5602 Carpal tunnel syndrome, left upper limb: Secondary | ICD-10-CM | POA: Diagnosis not present

## 2015-04-07 LAB — BASIC METABOLIC PANEL
Anion gap: 8 (ref 5–15)
BUN: 13 mg/dL (ref 6–20)
CALCIUM: 9 mg/dL (ref 8.9–10.3)
CO2: 25 mmol/L (ref 22–32)
Chloride: 104 mmol/L (ref 101–111)
Creatinine, Ser: 0.63 mg/dL (ref 0.61–1.24)
GFR calc Af Amer: >60 mL/min (ref >60)
GFR calc non Af Amer: >60 mL/min (ref >60)
Glucose, Bld: 105 mg/dL — ABNORMAL HIGH (ref 65–99)
POTASSIUM: 4.1 mmol/L (ref 3.5–5.1)
SODIUM: 137 mmol/L (ref 135–145)

## 2015-04-08 ENCOUNTER — Ambulatory Visit (HOSPITAL_BASED_OUTPATIENT_CLINIC_OR_DEPARTMENT_OTHER): Payer: BLUE CROSS/BLUE SHIELD | Admitting: Anesthesiology

## 2015-04-08 ENCOUNTER — Encounter (HOSPITAL_BASED_OUTPATIENT_CLINIC_OR_DEPARTMENT_OTHER): Payer: Self-pay | Admitting: Orthopedic Surgery

## 2015-04-08 ENCOUNTER — Encounter (HOSPITAL_BASED_OUTPATIENT_CLINIC_OR_DEPARTMENT_OTHER)
Admission: RE | Disposition: A | Discharge status: Home Health | Payer: Self-pay | Source: Ambulatory Visit | Attending: Orthopedic Surgery

## 2015-04-08 ENCOUNTER — Ambulatory Visit (HOSPITAL_BASED_OUTPATIENT_CLINIC_OR_DEPARTMENT_OTHER)
Admission: RE | Admit: 2015-04-08 | Discharge: 2015-04-08 | Disposition: A | Discharge status: Home Health | Payer: BLUE CROSS/BLUE SHIELD | Source: Ambulatory Visit | Attending: Orthopedic Surgery | Admitting: Orthopedic Surgery

## 2015-04-08 DIAGNOSIS — G5601 Carpal tunnel syndrome, right upper limb: Secondary | ICD-10-CM | POA: Insufficient documentation

## 2015-04-08 DIAGNOSIS — G5602 Carpal tunnel syndrome, left upper limb: Secondary | ICD-10-CM | POA: Diagnosis not present

## 2015-04-08 DIAGNOSIS — G4733 Obstructive sleep apnea (adult) (pediatric): Secondary | ICD-10-CM | POA: Insufficient documentation

## 2015-04-08 DIAGNOSIS — Z6833 Body mass index (BMI) 33.0-33.9, adult: Secondary | ICD-10-CM | POA: Insufficient documentation

## 2015-04-08 DIAGNOSIS — Z7982 Long term (current) use of aspirin: Secondary | ICD-10-CM | POA: Insufficient documentation

## 2015-04-08 DIAGNOSIS — D6851 Activated protein C resistance: Secondary | ICD-10-CM | POA: Insufficient documentation

## 2015-04-08 DIAGNOSIS — I10 Essential (primary) hypertension: Secondary | ICD-10-CM | POA: Insufficient documentation

## 2015-04-08 DIAGNOSIS — E119 Type 2 diabetes mellitus without complications: Secondary | ICD-10-CM | POA: Insufficient documentation

## 2015-04-08 DIAGNOSIS — E785 Hyperlipidemia, unspecified: Secondary | ICD-10-CM | POA: Insufficient documentation

## 2015-04-08 DIAGNOSIS — Z79899 Other long term (current) drug therapy: Secondary | ICD-10-CM | POA: Insufficient documentation

## 2015-04-08 DIAGNOSIS — Z9989 Dependence on other enabling machines and devices: Secondary | ICD-10-CM | POA: Insufficient documentation

## 2015-04-08 HISTORY — DX: Carpal tunnel syndrome, unspecified upper limb: G56.00

## 2015-04-08 HISTORY — PX: CARPAL TUNNEL RELEASE: SHX101

## 2015-04-08 LAB — GLUCOSE, CAPILLARY: GLUCOSE-CAPILLARY: 131 mg/dL — AB (ref 65–99)

## 2015-04-08 LAB — POCT HEMOGLOBIN-HEMACUE: Hemoglobin: 15.5 g/dL (ref 13.0–17.0)

## 2015-04-08 SURGERY — CARPAL TUNNEL RELEASE
Anesthesia: Monitor Anesthesia Care | Site: Wrist | Laterality: Left

## 2015-04-08 MED ORDER — MIDAZOLAM HCL 2 MG/2ML IJ SOLN
INTRAMUSCULAR | Status: AC
  Filled 2015-04-08: qty 2

## 2015-04-08 MED ORDER — LIDOCAINE HCL (PF) 1 % IJ SOLN
INTRAMUSCULAR | Status: AC
  Filled 2015-04-08: qty 30

## 2015-04-08 MED ORDER — SCOPOLAMINE 1 MG/3DAYS TD PT72: 1.0000 | MEDICATED_PATCH | Freq: Once | TRANSDERMAL | Status: DC | PRN

## 2015-04-08 MED ORDER — FENTANYL CITRATE (PF) 100 MCG/2ML IJ SOLN: 25.0000 ug | INTRAMUSCULAR | Status: DC | PRN

## 2015-04-08 MED ORDER — FENTANYL CITRATE (PF) 100 MCG/2ML IJ SOLN
INTRAMUSCULAR | Status: AC
  Filled 2015-04-08: qty 4

## 2015-04-08 MED ORDER — MEPERIDINE HCL 25 MG/ML IJ SOLN: 6.2500 mg | INTRAMUSCULAR | Status: DC | PRN

## 2015-04-08 MED ORDER — BUPIVACAINE-EPINEPHRINE 0.25% -1:200000 IJ SOLN
INTRAMUSCULAR | Status: DC | PRN
  Administered 2015-04-08: 10 mL

## 2015-04-08 MED ORDER — PROPOFOL INFUSION 10 MG/ML OPTIME
INTRAVENOUS | Status: DC | PRN
  Administered 2015-04-08: 100 ug/kg/min via INTRAVENOUS

## 2015-04-08 MED ORDER — LACTATED RINGERS IV SOLN
INTRAVENOUS | Status: DC
  Administered 2015-04-08: 08:00:00 via INTRAVENOUS

## 2015-04-08 MED ORDER — ONDANSETRON HCL 4 MG/2ML IJ SOLN
INTRAMUSCULAR | Status: DC | PRN
  Administered 2015-04-08: 4 mg via INTRAVENOUS

## 2015-04-08 MED ORDER — FENTANYL CITRATE (PF) 100 MCG/2ML IJ SOLN
50.0000 ug | INTRAMUSCULAR | Status: DC | PRN
  Administered 2015-04-08: 50 ug via INTRAVENOUS

## 2015-04-08 MED ORDER — VANCOMYCIN HCL IN DEXTROSE 500-5 MG/100ML-% IV SOLN
INTRAVENOUS | Status: AC
  Filled 2015-04-08: qty 100

## 2015-04-08 MED ORDER — HYDROCODONE-ACETAMINOPHEN 5-325 MG PO TABS: 1.0000 | ORAL_TABLET | Freq: Four times a day (QID) | ORAL | Status: DC | PRN

## 2015-04-08 MED ORDER — BUPIVACAINE HCL (PF) 0.25 % IJ SOLN
INTRAMUSCULAR | Status: AC
Start: 2015-04-08 — End: 2015-04-08
  Filled 2015-04-08: qty 150

## 2015-04-08 MED ORDER — CHLORHEXIDINE GLUCONATE 4 % EX LIQD: 60.0000 mL | Freq: Once | CUTANEOUS | Status: DC

## 2015-04-08 MED ORDER — VANCOMYCIN HCL 10 G IV SOLR
1500.0000 mg | INTRAVENOUS | Status: AC
  Administered 2015-04-08: 1500 mg via INTRAVENOUS

## 2015-04-08 MED ORDER — MIDAZOLAM HCL 2 MG/2ML IJ SOLN
1.0000 mg | INTRAMUSCULAR | Status: DC | PRN
  Administered 2015-04-08: 1 mg via INTRAVENOUS

## 2015-04-08 MED ORDER — VANCOMYCIN HCL IN DEXTROSE 1-5 GM/200ML-% IV SOLN
INTRAVENOUS | Status: AC
  Filled 2015-04-08: qty 200

## 2015-04-08 MED ORDER — GLYCOPYRROLATE 0.2 MG/ML IJ SOLN: 0.2000 mg | Freq: Once | INTRAMUSCULAR | Status: DC | PRN

## 2015-04-08 MED ORDER — PROPOFOL 500 MG/50ML IV EMUL
INTRAVENOUS | Status: AC
  Filled 2015-04-08: qty 50

## 2015-04-08 SURGICAL SUPPLY — 38 items
BANDAGE COBAN STERILE 2 (GAUZE/BANDAGES/DRESSINGS) ×1 IMPLANT
BLADE SURG 15 STRL LF DISP TIS (BLADE) ×2 IMPLANT
BLADE SURG 15 STRL SS (BLADE) ×4
BNDG CMPR 9X4 STRL LF SNTH (GAUZE/BANDAGES/DRESSINGS)
BNDG COHESIVE 3X5 TAN STRL LF (GAUZE/BANDAGES/DRESSINGS) ×4 IMPLANT
BNDG ESMARK 4X9 LF (GAUZE/BANDAGES/DRESSINGS) IMPLANT
BNDG GAUZE ELAST 4 BULKY (GAUZE/BANDAGES/DRESSINGS) ×3 IMPLANT
CHLORAPREP W/TINT 26ML (MISCELLANEOUS) ×4 IMPLANT
CORDS BIPOLAR (ELECTRODE) ×3 IMPLANT
COVER BACK TABLE 60X90IN (DRAPES) ×4 IMPLANT
COVER MAYO STAND STRL (DRAPES) ×4 IMPLANT
CUFF TOURNIQUET SINGLE 18IN (TOURNIQUET CUFF) ×3 IMPLANT
DECANTER SPIKE VIAL GLASS SM (MISCELLANEOUS) IMPLANT
DRAPE EXTREMITY T 121X128X90 (DRAPE) ×4 IMPLANT
DRAPE SURG 17X23 STRL (DRAPES) ×4 IMPLANT
DRSG PAD ABDOMINAL 8X10 ST (GAUZE/BANDAGES/DRESSINGS) ×3 IMPLANT
GAUZE SPONGE 4X4 12PLY STRL (GAUZE/BANDAGES/DRESSINGS) ×4 IMPLANT
GAUZE XEROFORM 1X8 LF (GAUZE/BANDAGES/DRESSINGS) ×4 IMPLANT
GLOVE BIO SURGEON STRL SZ 6.5 (GLOVE) ×2 IMPLANT
GLOVE BIO SURGEONS STRL SZ 6.5 (GLOVE) ×1
GLOVE BIOGEL PI IND STRL 7.0 (GLOVE) ×2 IMPLANT
GLOVE BIOGEL PI IND STRL 8.5 (GLOVE) ×2 IMPLANT
GLOVE BIOGEL PI INDICATOR 7.0 (GLOVE) ×4
GLOVE BIOGEL PI INDICATOR 8.5 (GLOVE) ×2
GLOVE SURG ORTHO 8.0 STRL STRW (GLOVE) ×4 IMPLANT
GOWN STRL REUS W/ TWL LRG LVL3 (GOWN DISPOSABLE) ×2 IMPLANT
GOWN STRL REUS W/TWL LRG LVL3 (GOWN DISPOSABLE) ×4
GOWN STRL REUS W/TWL XL LVL3 (GOWN DISPOSABLE) ×4 IMPLANT
NDL PRECISIONGLIDE 27X1.5 (NEEDLE) ×1 IMPLANT
NEEDLE PRECISIONGLIDE 27X1.5 (NEEDLE) ×4 IMPLANT
NS IRRIG 1000ML POUR BTL (IV SOLUTION) ×4 IMPLANT
PACK BASIN DAY SURGERY FS (CUSTOM PROCEDURE TRAY) ×4 IMPLANT
STOCKINETTE 4X48 STRL (DRAPES) ×4 IMPLANT
SUT ETHILON 4 0 PS 2 18 (SUTURE) ×5 IMPLANT
SYR BULB 3OZ (MISCELLANEOUS) ×4 IMPLANT
SYR CONTROL 10ML LL (SYRINGE) ×4 IMPLANT
TOWEL OR 17X24 6PK STRL BLUE (TOWEL DISPOSABLE) ×8 IMPLANT
UNDERPAD 30X30 (UNDERPADS AND DIAPERS) ×4 IMPLANT

## 2015-04-08 NOTE — Anesthesia Postprocedure Evaluation (Signed)
  Anesthesia Post-op Note  Patient: Carl [name]  Procedure(s) Performed: Procedure(s): LEFT CARPAL TUNNEL RELEASE (Left)  Patient Location: PACU  Anesthesia Type: MAC, Bier Block   Level of Consciousness: awake, alert  and oriented  Airway and Oxygen Therapy: Patient Spontanous Breathing  Post-op Pain: none  Post-op Assessment: Post-op Vital signs reviewed  Post-op Vital Signs: Reviewed  Last Vitals:  Filed Vitals:   04/08/15 0930  BP: 124/74  Pulse: 65  Temp:   Resp: 14    Complications: No apparent anesthesia complications

## 2015-04-08 NOTE — Brief Op Note (Signed)
04/08/2015  9:14 AM  PATIENT:  Carl [name]  58 y.o. male  PRE-OPERATIVE DIAGNOSIS:  LEFT CARPAL TUNNEL SYNDROME  POST-OPERATIVE DIAGNOSIS:  LEFT CARPAL TUNNEL SYNDROME  PROCEDURE:  Procedure(s): LEFT CARPAL TUNNEL RELEASE (Left)  SURGEON:  Surgeon(s) and Role:    * Daryll Brod, MD - Primary  PHYSICIAN ASSISTANT:   ASSISTANTS: none   ANESTHESIA:   local and regional  EBL:  Total I/O In: 1000 [I.V.:1000] Out: -   BLOOD ADMINISTERED:none  DRAINS: none   LOCAL MEDICATIONS USED:  BUPIVICAINE   SPECIMEN:  No Specimen  DISPOSITION OF SPECIMEN:  N/A  COUNTS:  YES  TOURNIQUET:   Total Tourniquet Time Documented: Forearm (Left) - 26 minutes Total: Forearm (Left) - 26 minutes   DICTATION: .Other Dictation: Dictation Number [id number]  PLAN OF CARE: Discharge to home after PACU  PATIENT DISPOSITION:  PACU - hemodynamically stable.

## 2015-04-08 NOTE — Transfer of Care (Signed)
Immediate Anesthesia Transfer of Care Note  Patient: Carl [name]  Procedure(s) Performed: Procedure(s): LEFT CARPAL TUNNEL RELEASE (Left)  Patient Location: PACU  Anesthesia Type:Bier block  Level of Consciousness: awake, sedated and patient cooperative  Airway & Oxygen Therapy: Patient Spontanous Breathing and Patient connected to face mask oxygen  Post-op Assessment: Report given to RN and Post -op Vital signs reviewed and stable  Post vital signs: Reviewed and stable  Last Vitals:  Filed Vitals:   04/08/15 0910  BP:   Pulse: 73  Temp:   Resp:     Complications: No apparent anesthesia complications

## 2015-04-08 NOTE — Discharge Instructions (Addendum)

## 2015-04-08 NOTE — Op Note (Signed)
Dictation Number [id number]

## 2015-04-08 NOTE — Anesthesia Preprocedure Evaluation (Addendum)
Anesthesia Evaluation  Patient identified by MRN, date of birth, ID band Patient awake    Reviewed: Allergy & Precautions, NPO status , Patient's Chart, lab work & pertinent test results  Airway Mallampati: I  TM Distance: >3 FB Neck ROM: Full    Dental  (+) Teeth Intact, Dental Advisory Given   Pulmonary sleep apnea and Continuous Positive Airway Pressure Ventilation ,  breath sounds clear to auscultation        Cardiovascular hypertension, Pt. on medications Rhythm:Regular Rate:Normal     Neuro/Psych    GI/Hepatic   Endo/Other  diabetes, Well Controlled, Type 2, Oral Hypoglycemic AgentsMorbid obesity  Renal/GU      Musculoskeletal   Abdominal   Peds  Hematology   Anesthesia Other Findings   Reproductive/Obstetrics                            Anesthesia Physical Anesthesia Plan  ASA: III  Anesthesia Plan: MAC and Bier Block   Post-op Pain Management:    Induction: Intravenous  Airway Management Planned: Simple Face Mask  Additional Equipment:   Intra-op Plan:   Post-operative Plan:   Informed Consent: I have reviewed the patients History and Physical, chart, labs and discussed the procedure including the risks, benefits and alternatives for the proposed anesthesia with the patient or authorized representative who has indicated his/her understanding and acceptance.   Dental advisory given  Plan Discussed with: CRNA, Surgeon and Anesthesiologist  Anesthesia Plan Comments:         Anesthesia Quick Evaluation
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
## 2015-04-09 ENCOUNTER — Encounter (HOSPITAL_BASED_OUTPATIENT_CLINIC_OR_DEPARTMENT_OTHER): Payer: Self-pay | Admitting: Orthopedic Surgery

## 2015-04-09 NOTE — Op Note (Signed)
NAMECUTBERTO, Carl [name]             ACCOUNT NO.:  [id number]  MEDICAL RECORD NO.:  [id number]  LOCATION:                               FACILITY:  [facility]  PHYSICIAN:  Daryll Brod, M.D.       DATE OF BIRTH:  05-15-1956  DATE OF PROCEDURE:  04/08/2015 DATE OF DISCHARGE:  04/08/2015                              OPERATIVE REPORT   PREOPERATIVE DIAGNOSIS:  Carpal tunnel syndrome, left hand.  POSTOPERATIVE DIAGNOSIS:  Carpal tunnel syndrome, left hand.  OPERATION:  Decompression of left median nerve.  SURGEON:  Daryll Brod, M.D.  ANESTHESIA:  Forearm-based IV regional with local infiltration using Sensorcaine with epinephrine.  SURGEON:  Daryll Brod, M.D.  ANESTHESIOLOGIST:  Lorrene Reid, M.D.  HISTORY:  The patient is a 59 year old male with history of bilateral carpal tunnel syndrome.  Nerve conductions are positive.  This has not responded to conservative treatment.  He has elected to undergo surgical decompression of the median nerve, left side.  Pre, peri, and postoperative course have been discussed along with the risks and complications.  He is aware that there is no guarantee with the surgery; possibility of infection; recurrence of injury to arteries, nerves, tendons; incomplete release of symptoms and dystrophy.  In the preoperative area, the patient is seen, the extremity marked by both patient and surgeon, and antibiotic given.  DESCRIPTION OF PROCEDURE:  The patient was brought to the operating room, where a forearm-based IV regional anesthetic was carried out without difficulty.  He was prepped using ChloraPrep, supine position, left arm free.  A 3-minute dry time was allowed.  Time-out taken confirming the patient and procedure.  A longitudinal incision was made on the left palm, carried down through the subcutaneous tissue. Bleeders were meticulously cauterized with bipolar.  Palmar fascia was split, superficial palmar arch identified.  The flexor tendon of the ring  and little finger identified to the ulnar side of the median nerve. Carpal retinaculum was incised with sharp dissection.  Right angle and Sewall retractor were placed between the skin and forearm fascia.  The fascia was released for approximately 2 cm proximal to the wrist crease under direct vision.  The canal was explored.  Air compression to the nerve was immediately apparent.  Motor branch entered into the muscle distally, no further lesions were identified.  The wound was copiously irrigated with saline and closed with interrupted 4-0 nylon sutures.  A local infiltration with 0.25% bupivacaine with epinephrine was given, 10 mL was used.  Sterile compressive dressing was applied.  On deflation of the tourniquet, all fingers were immediately pinked.  He was taken to the recovery room for observation in satisfactory condition.  He will be discharged to home to return to the [facility] in 1 week, on [place].    ______________________________ Daryll Brod, M.D.   ______________________________ Daryll Brod, M.D.    GK/MEDQ  D:  04/08/2015  T:  04/09/2015  Job:  [id number]

## 2015-04-15 ENCOUNTER — Other Ambulatory Visit: Payer: Self-pay | Admitting: Orthopedic Surgery

## 2015-04-21 ENCOUNTER — Encounter (HOSPITAL_BASED_OUTPATIENT_CLINIC_OR_DEPARTMENT_OTHER): Payer: Self-pay | Admitting: Orthopedic Surgery

## 2015-04-30 ENCOUNTER — Encounter (HOSPITAL_BASED_OUTPATIENT_CLINIC_OR_DEPARTMENT_OTHER): Payer: Self-pay | Admitting: *Deleted

## 2015-05-06 ENCOUNTER — Ambulatory Visit (HOSPITAL_BASED_OUTPATIENT_CLINIC_OR_DEPARTMENT_OTHER): Payer: BLUE CROSS/BLUE SHIELD | Admitting: Certified Registered"

## 2015-05-06 ENCOUNTER — Encounter (HOSPITAL_BASED_OUTPATIENT_CLINIC_OR_DEPARTMENT_OTHER)
Admission: RE | Disposition: A | Discharge status: Home / Self Care | Payer: Self-pay | Source: Ambulatory Visit | Attending: Orthopedic Surgery

## 2015-05-06 ENCOUNTER — Ambulatory Visit (HOSPITAL_BASED_OUTPATIENT_CLINIC_OR_DEPARTMENT_OTHER)
Admission: RE | Admit: 2015-05-06 | Discharge: 2015-05-06 | Disposition: A | Discharge status: Home / Self Care | Payer: BLUE CROSS/BLUE SHIELD | Source: Ambulatory Visit | Attending: Orthopedic Surgery | Admitting: Orthopedic Surgery

## 2015-05-06 ENCOUNTER — Encounter (HOSPITAL_BASED_OUTPATIENT_CLINIC_OR_DEPARTMENT_OTHER): Payer: Self-pay | Admitting: Certified Registered"

## 2015-05-06 DIAGNOSIS — Z7982 Long term (current) use of aspirin: Secondary | ICD-10-CM | POA: Insufficient documentation

## 2015-05-06 DIAGNOSIS — D6851 Activated protein C resistance: Secondary | ICD-10-CM | POA: Diagnosis not present

## 2015-05-06 DIAGNOSIS — E119 Type 2 diabetes mellitus without complications: Secondary | ICD-10-CM | POA: Insufficient documentation

## 2015-05-06 DIAGNOSIS — G5602 Carpal tunnel syndrome, left upper limb: Secondary | ICD-10-CM | POA: Diagnosis not present

## 2015-05-06 DIAGNOSIS — I10 Essential (primary) hypertension: Secondary | ICD-10-CM | POA: Insufficient documentation

## 2015-05-06 DIAGNOSIS — G5601 Carpal tunnel syndrome, right upper limb: Secondary | ICD-10-CM | POA: Diagnosis present

## 2015-05-06 DIAGNOSIS — G4733 Obstructive sleep apnea (adult) (pediatric): Secondary | ICD-10-CM | POA: Diagnosis not present

## 2015-05-06 HISTORY — PX: CARPAL TUNNEL RELEASE: SHX101

## 2015-05-06 SURGERY — CARPAL TUNNEL RELEASE
Anesthesia: Monitor Anesthesia Care | Site: Wrist | Laterality: Right

## 2015-05-06 MED ORDER — FENTANYL CITRATE (PF) 100 MCG/2ML IJ SOLN
INTRAMUSCULAR | Status: AC
  Filled 2015-05-06: qty 2

## 2015-05-06 MED ORDER — VANCOMYCIN HCL 10 G IV SOLR
1500.0000 mg | INTRAVENOUS | Status: AC
  Administered 2015-05-06: 1000 mg via INTRAVENOUS

## 2015-05-06 MED ORDER — LACTATED RINGERS IV SOLN
INTRAVENOUS | Status: DC
  Administered 2015-05-06 (×2): via INTRAVENOUS

## 2015-05-06 MED ORDER — HYDROCODONE-ACETAMINOPHEN 5-325 MG PO TABS: 1.0000 | ORAL_TABLET | Freq: Four times a day (QID) | ORAL | Status: DC | PRN

## 2015-05-06 MED ORDER — VANCOMYCIN HCL IN DEXTROSE 1-5 GM/200ML-% IV SOLN: 1000.0000 mg | INTRAVENOUS | Status: DC

## 2015-05-06 MED ORDER — MIDAZOLAM HCL 2 MG/2ML IJ SOLN
INTRAMUSCULAR | Status: AC
  Filled 2015-05-06: qty 2

## 2015-05-06 MED ORDER — HYDROMORPHONE HCL 1 MG/ML IJ SOLN: 0.2500 mg | INTRAMUSCULAR | Status: DC | PRN

## 2015-05-06 MED ORDER — CHLORHEXIDINE GLUCONATE 4 % EX LIQD: 60.0000 mL | Freq: Once | CUTANEOUS | Status: DC

## 2015-05-06 MED ORDER — FENTANYL CITRATE (PF) 100 MCG/2ML IJ SOLN
50.0000 ug | INTRAMUSCULAR | Status: DC | PRN
  Administered 2015-05-06: 50 ug via INTRAVENOUS

## 2015-05-06 MED ORDER — SCOPOLAMINE 1 MG/3DAYS TD PT72: 1.0000 | MEDICATED_PATCH | Freq: Once | TRANSDERMAL | Status: DC | PRN

## 2015-05-06 MED ORDER — LIDOCAINE HCL (PF) 0.5 % IJ SOLN
INTRAMUSCULAR | Status: DC | PRN
  Administered 2015-05-06: 30 mL via INTRAVENOUS

## 2015-05-06 MED ORDER — BUPIVACAINE HCL (PF) 0.25 % IJ SOLN
INTRAMUSCULAR | Status: DC | PRN
  Administered 2015-05-06: 7 mL

## 2015-05-06 MED ORDER — GLYCOPYRROLATE 0.2 MG/ML IJ SOLN: 0.2000 mg | Freq: Once | INTRAMUSCULAR | Status: DC | PRN

## 2015-05-06 MED ORDER — MIDAZOLAM HCL 2 MG/2ML IJ SOLN
1.0000 mg | INTRAMUSCULAR | Status: DC | PRN
  Administered 2015-05-06: 1 mg via INTRAVENOUS

## 2015-05-06 MED ORDER — PROPOFOL INFUSION 10 MG/ML OPTIME
INTRAVENOUS | Status: DC | PRN
  Administered 2015-05-06: 75 ug/kg/min via INTRAVENOUS

## 2015-05-06 MED ORDER — VANCOMYCIN HCL IN DEXTROSE 1-5 GM/200ML-% IV SOLN
INTRAVENOUS | Status: AC
  Filled 2015-05-06: qty 200

## 2015-05-06 MED ORDER — ONDANSETRON HCL 4 MG/2ML IJ SOLN
INTRAMUSCULAR | Status: DC | PRN
  Administered 2015-05-06: 4 mg via INTRAVENOUS

## 2015-05-06 SURGICAL SUPPLY — 36 items
BLADE SURG 15 STRL LF DISP TIS (BLADE) ×1 IMPLANT
BLADE SURG 15 STRL SS (BLADE) ×3
BNDG CMPR 9X4 STRL LF SNTH (GAUZE/BANDAGES/DRESSINGS) ×1
BNDG COHESIVE 3X5 TAN STRL LF (GAUZE/BANDAGES/DRESSINGS) ×3 IMPLANT
BNDG ESMARK 4X9 LF (GAUZE/BANDAGES/DRESSINGS) ×2 IMPLANT
BNDG GAUZE ELAST 4 BULKY (GAUZE/BANDAGES/DRESSINGS) ×3 IMPLANT
CHLORAPREP W/TINT 26ML (MISCELLANEOUS) ×3 IMPLANT
CORDS BIPOLAR (ELECTRODE) ×3 IMPLANT
COVER BACK TABLE 60X90IN (DRAPES) ×3 IMPLANT
COVER MAYO STAND STRL (DRAPES) ×3 IMPLANT
CUFF TOURNIQUET SINGLE 18IN (TOURNIQUET CUFF) ×3 IMPLANT
DRAPE EXTREMITY T 121X128X90 (DRAPE) ×3 IMPLANT
DRAPE SURG 17X23 STRL (DRAPES) ×3 IMPLANT
DRSG PAD ABDOMINAL 8X10 ST (GAUZE/BANDAGES/DRESSINGS) ×3 IMPLANT
GAUZE SPONGE 4X4 12PLY STRL (GAUZE/BANDAGES/DRESSINGS) ×3 IMPLANT
GAUZE XEROFORM 1X8 LF (GAUZE/BANDAGES/DRESSINGS) ×3 IMPLANT
GLOVE BIOGEL PI IND STRL 7.0 (GLOVE) IMPLANT
GLOVE BIOGEL PI IND STRL 8.5 (GLOVE) ×1 IMPLANT
GLOVE BIOGEL PI INDICATOR 7.0 (GLOVE) ×2
GLOVE BIOGEL PI INDICATOR 8.5 (GLOVE) ×2
GLOVE ECLIPSE 6.5 STRL STRAW (GLOVE) ×2 IMPLANT
GLOVE SURG ORTHO 8.0 STRL STRW (GLOVE) ×3 IMPLANT
GOWN STRL REUS W/ TWL LRG LVL3 (GOWN DISPOSABLE) ×1 IMPLANT
GOWN STRL REUS W/TWL LRG LVL3 (GOWN DISPOSABLE) ×3
GOWN STRL REUS W/TWL XL LVL3 (GOWN DISPOSABLE) ×3 IMPLANT
NDL PRECISIONGLIDE 27X1.5 (NEEDLE) IMPLANT
NEEDLE PRECISIONGLIDE 27X1.5 (NEEDLE) ×3 IMPLANT
NS IRRIG 1000ML POUR BTL (IV SOLUTION) ×3 IMPLANT
PACK BASIN DAY SURGERY FS (CUSTOM PROCEDURE TRAY) ×3 IMPLANT
STOCKINETTE 4X48 STRL (DRAPES) ×3 IMPLANT
SUT ETHILON 4 0 PS 2 18 (SUTURE) ×3 IMPLANT
SUT VICRYL 4-0 PS2 18IN ABS (SUTURE) IMPLANT
SYR BULB 3OZ (MISCELLANEOUS) ×3 IMPLANT
SYR CONTROL 10ML LL (SYRINGE) ×2 IMPLANT
TOWEL OR 17X24 6PK STRL BLUE (TOWEL DISPOSABLE) ×3 IMPLANT
UNDERPAD 30X30 (UNDERPADS AND DIAPERS) ×1 IMPLANT

## 2015-05-06 NOTE — Brief Op Note (Signed)
05/06/2015  10:49 AM  PATIENT:  Carl [name]  59 y.o. male  PRE-OPERATIVE DIAGNOSIS:  RIGHT CARPEL TUNNEL SYNDROME  POST-OPERATIVE DIAGNOSIS:  RIGHT CARPEL TUNNEL SYNDROME  PROCEDURE:  Procedure(s) with comments: RIGHT CARPAL TUNNEL RELEASE (Right) - ANESTHESIA:  IV REGIONAL FAB  SURGEON:  Surgeon(s) and Role:    * Daryll Brod, MD - Primary  PHYSICIAN ASSISTANT:   ASSISTANTS: none   ANESTHESIA:   local and regional  EBL:  Total I/O In: 1000 [I.V.:1000] Out: -   BLOOD ADMINISTERED:none  DRAINS: none   LOCAL MEDICATIONS USED:  BUPIVICAINE   SPECIMEN:  No Specimen  DISPOSITION OF SPECIMEN:  N/A  COUNTS:  YES  TOURNIQUET:   Total Tourniquet Time Documented: Forearm (Right) - 15 minutes Total: Forearm (Right) - 15 minutes   DICTATION: .Other Dictation: Dictation Number [id number]  PLAN OF CARE: Discharge to home after PACU  PATIENT DISPOSITION:  PACU - hemodynamically stable.

## 2015-05-06 NOTE — Anesthesia Postprocedure Evaluation (Signed)
  Anesthesia Post-op Note  Patient: Carl [name]  Procedure(s) Performed: Procedure(s) with comments: RIGHT CARPAL TUNNEL RELEASE (Right) - ANESTHESIA:  IV REGIONAL FAB  Patient Location: PACU  Anesthesia Type: MAC  Level of Consciousness: awake and alert   Airway and Oxygen Therapy: Patient Spontanous Breathing  Post-op Pain: Controlled  Post-op Assessment: Post-op Vital signs reviewed, Patient's Cardiovascular Status Stable and Respiratory Function Stable  Post-op Vital Signs: Reviewed  Filed Vitals:   05/06/15 1115  BP: 113/77  Pulse: 56  Temp:   Resp: 18    Complications: No apparent anesthesia complications
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
## 2015-05-06 NOTE — Discharge Instructions (Addendum)

## 2015-05-06 NOTE — Anesthesia Procedure Notes (Addendum)
Procedure Name: MAC Date/Time: 05/06/2015 10:25 AM Performed by: Ivonne Freeburg, Mosie D Pre-anesthesia Checklist: Patient identified, Emergency Drugs available, Suction available, Patient being monitored and Timeout performed Patient Re-evaluated:Patient Re-evaluated prior to inductionOxygen Delivery Method: Simple face mask   Anesthesia Regional Block:  Bier block (IV Regional)  Pre-Anesthetic Checklist: ,, timeout performed, Correct Patient, Correct Site, Correct Laterality, Correct Procedure,, site marked, surgical consent,, at surgeon's request Needles:  Injection technique: Single-shot  Needle Type: Other      Needle Gauge: 20 and 20 G    Additional Needles: Bier block (IV Regional) Narrative:   Performed by: Personally

## 2015-05-06 NOTE — Transfer of Care (Signed)
Immediate Anesthesia Transfer of Care Note  Patient: Carl [name]  Procedure(s) Performed: Procedure(s) with comments: RIGHT CARPAL TUNNEL RELEASE (Right) - ANESTHESIA:  IV REGIONAL FAB  Patient Location: PACU  Anesthesia Type:Bier block  Level of Consciousness: awake, alert , oriented and patient cooperative  Airway & Oxygen Therapy: Patient Spontanous Breathing and Patient connected to face mask oxygen  Post-op Assessment: Report given to RN and Post -op Vital signs reviewed and stable  Post vital signs: Reviewed and stable  Last Vitals:  Filed Vitals:   05/06/15 0826  BP: 135/77  Pulse: 57  Temp: 36.6 C  Resp: 18    Complications: No apparent anesthesia complications

## 2015-05-06 NOTE — Anesthesia Preprocedure Evaluation (Addendum)
Anesthesia Evaluation  Patient identified by MRN, date of birth, ID band Patient awake    Reviewed: Allergy & Precautions, H&P , NPO status , Patient's Chart, lab work & pertinent test results  Airway Mallampati: II  TM Distance: >3 FB Neck ROM: Full    Dental no notable dental hx. (+) Teeth Intact, Dental Advisory Given   Pulmonary sleep apnea and Continuous Positive Airway Pressure Ventilation ,  breath sounds clear to auscultation  Pulmonary exam normal       Cardiovascular hypertension, Pt. on medications Rhythm:Regular Rate:Normal     Neuro/Psych  Neuromuscular disease negative psych ROS   GI/Hepatic negative GI ROS, Neg liver ROS,   Endo/Other  diabetes, Type 2  Renal/GU negative Renal ROS  negative genitourinary   Musculoskeletal   Abdominal   Peds  Hematology negative hematology ROS (+)   Anesthesia Other Findings   Reproductive/Obstetrics negative OB ROS                            Anesthesia Physical Anesthesia Plan  ASA: III  Anesthesia Plan: MAC and Bier Block   Post-op Pain Management:    Induction: Intravenous  Airway Management Planned: Simple Face Mask  Additional Equipment:   Intra-op Plan:   Post-operative Plan:   Informed Consent: I have reviewed the patients History and Physical, chart, labs and discussed the procedure including the risks, benefits and alternatives for the proposed anesthesia with the patient or authorized representative who has indicated his/her understanding and acceptance.   Dental advisory given  Plan Discussed with: CRNA  Anesthesia Plan Comments:         Anesthesia Quick Evaluation

## 2015-05-06 NOTE — Op Note (Signed)
Dictation Number [id number]

## 2015-05-07 ENCOUNTER — Encounter (HOSPITAL_BASED_OUTPATIENT_CLINIC_OR_DEPARTMENT_OTHER): Payer: Self-pay | Admitting: Orthopedic Surgery

## 2015-05-07 NOTE — Op Note (Signed)
[name], [name]             ACCOUNT NO.:  [id number]  MEDICAL RECORD NO.:  [id number]  LOCATION:                                 FACILITY:  PHYSICIAN:  Daryll Brod, M.D.       DATE OF BIRTH:  28-Apr-1956  DATE OF PROCEDURE:  05/06/2015 DATE OF DISCHARGE:                              OPERATIVE REPORT   PREOPERATIVE DIAGNOSIS:  Carpal tunnel syndrome, right hand.  POSTOPERATIVE DIAGNOSIS:  Carpal tunnel syndrome, right hand.  OPERATION:  Decompression of right median nerve.  SURGEON:  Daryll Brod, M.D.  ANESTHESIA:  Forearm-based IV regional with local infiltration.  ANESTHESIOLOGIST:  Soledad Gerlach, MD  HISTORY:  The patient is a 59 year old male with a history of bilateral carpal tunnel syndrome.  He has undergone release on his right side.  He does have positive nerve conductions.  This is not responded to conservative treatment.  His left side is done very well.  He is admitted now for release of his right.  Pre, peri, and postoperative courses have been discussed along with risks and complications.  He is aware there is no guarantee with surgery; possibility of infection; recurrence of injury to arteries, nerves, tendons; incomplete relief of symptoms; dystrophy.  In the preoperative area, the patient is seen, the extremity marked by both patient and surgeon.  Antibiotic given.  PROCEDURE IN DETAIL:  The patient was brought to the operating room, where forearm-based IV regional anesthetic was carried out without difficulty.  He was prepped using ChloraPrep, supine position with the right arm free.  A 3-minute dry time was allowed.  Time-out taken, confirming the patient and procedure.  A longitudinal incision was made in the right palm, carried down through subcutaneous tissue.  Bleeders were meticulously cauterized with bipolar.  Palmar fascia was split. Superficial palmar arch identified.  The flexor tendon to the ring and little finger identified.   Retractors placed sweeping the median nerve to the radial side, ulnar nerves to the ulnar side.  The flexor retinaculum was then incised with sharp dissection.  A right angle and Sewall retractors placed between the distal forearm fascia and the skin. The deep fascia was then released for approximately 2 cm proximal to the wrist crease under direct vision.  Canal was explored.  Air compression to the nerve was apparent.  Motor branch entered into muscle distally. No further lesions were identified.  The wound was copiously irrigated with saline, and the skin was closed with interrupted 2-0 nylon sutures. Local infiltration with 0.25% bupivacaine without epinephrine was given, approximately 7 mL was used.  Sterile compressive dressing with fingers free was applied.  On deflation of the tourniquet, all fingers immediately pinked.  He was taken to the recovery room for observation in satisfactory condition.  He will be discharged home to return to the [place] in 1 week on Norco.          ______________________________ Daryll Brod, M.D.     GK/MEDQ  D:  05/06/2015  T:  05/07/2015  Job:  [id number]

## 2015-06-04 ENCOUNTER — Other Ambulatory Visit (HOSPITAL_COMMUNITY)
Admission: RE | Admit: 2015-06-04 | Discharge: 2015-06-04 | Disposition: A | Discharge status: Home / Self Care | Payer: BLUE CROSS/BLUE SHIELD | Source: Ambulatory Visit | Attending: Internal Medicine | Admitting: Internal Medicine

## 2015-06-04 DIAGNOSIS — Z125 Encounter for screening for malignant neoplasm of prostate: Secondary | ICD-10-CM | POA: Insufficient documentation

## 2015-06-04 DIAGNOSIS — Z79899 Other long term (current) drug therapy: Secondary | ICD-10-CM | POA: Diagnosis not present

## 2015-06-04 DIAGNOSIS — I10 Essential (primary) hypertension: Secondary | ICD-10-CM | POA: Diagnosis not present

## 2015-06-04 DIAGNOSIS — E119 Type 2 diabetes mellitus without complications: Secondary | ICD-10-CM | POA: Insufficient documentation

## 2015-06-04 DIAGNOSIS — E785 Hyperlipidemia, unspecified: Secondary | ICD-10-CM | POA: Diagnosis not present

## 2015-06-04 LAB — LIPID PANEL
CHOL/HDL RATIO: 3.9 ratio
Cholesterol: 144 mg/dL (ref 0–200)
HDL: 37 mg/dL — AB (ref >40)
LDL CALC: 90 mg/dL (ref 0–99)
Triglycerides: 87 mg/dL (ref <150)
VLDL: 17 mg/dL (ref 0–40)

## 2015-06-04 LAB — CBC
HCT: 43.2 % (ref 39.0–52.0)
HEMOGLOBIN: 14.8 g/dL (ref 13.0–17.0)
MCH: 29 pg (ref 26.0–34.0)
MCHC: 34.3 g/dL (ref 30.0–36.0)
MCV: 84.7 fL (ref 78.0–100.0)
Platelets: 237 10*3/uL (ref 150–400)
RBC: 5.1 MIL/uL (ref 4.22–5.81)
RDW: 12.6 % (ref 11.5–15.5)
WBC: 6.6 10*3/uL (ref 4.0–10.5)

## 2015-06-04 LAB — COMPREHENSIVE METABOLIC PANEL
ALBUMIN: 4.1 g/dL (ref 3.5–5.0)
ALK PHOS: 60 U/L (ref 38–126)
ALT: 28 U/L (ref 17–63)
ANION GAP: 6 (ref 5–15)
AST: 21 U/L (ref 15–41)
BUN: 14 mg/dL (ref 6–20)
CALCIUM: 8.6 mg/dL — AB (ref 8.9–10.3)
CO2: 26 mmol/L (ref 22–32)
Chloride: 107 mmol/L (ref 101–111)
Creatinine, Ser: 0.63 mg/dL (ref 0.61–1.24)
GFR calc Af Amer: >60 mL/min (ref >60)
GFR calc non Af Amer: >60 mL/min (ref >60)
GLUCOSE: 110 mg/dL — AB (ref 65–99)
POTASSIUM: 4 mmol/L (ref 3.5–5.1)
SODIUM: 139 mmol/L (ref 135–145)
Total Bilirubin: 0.6 mg/dL (ref 0.3–1.2)
Total Protein: 7.1 g/dL (ref 6.5–8.1)

## 2015-06-04 LAB — PSA: PSA: 1.58 ng/mL (ref 0.00–4.00)

## 2015-06-05 LAB — HEMOGLOBIN A1C
Hgb A1c MFr Bld: 5.9 % — ABNORMAL HIGH (ref 4.8–5.6)
Mean Plasma Glucose: 123 mg/dL

## 2015-06-05 LAB — MICROALBUMIN, URINE: Microalb, Ur: 5.8 ug/mL — ABNORMAL HIGH

## 2015-11-27 ENCOUNTER — Other Ambulatory Visit (HOSPITAL_COMMUNITY)
Admission: RE | Admit: 2015-11-27 | Discharge: 2015-11-27 | Disposition: A | Discharge status: Home / Self Care | Payer: BLUE CROSS/BLUE SHIELD | Source: Ambulatory Visit | Attending: Internal Medicine | Admitting: Internal Medicine

## 2015-11-27 DIAGNOSIS — E119 Type 2 diabetes mellitus without complications: Secondary | ICD-10-CM | POA: Insufficient documentation

## 2015-11-28 LAB — HEMOGLOBIN A1C
Hgb A1c MFr Bld: 5.7 % — ABNORMAL HIGH (ref 4.8–5.6)
Mean Plasma Glucose: 117 mg/dL

## 2015-12-15 ENCOUNTER — Other Ambulatory Visit (HOSPITAL_COMMUNITY): Payer: Self-pay | Admitting: Internal Medicine

## 2015-12-15 ENCOUNTER — Ambulatory Visit (HOSPITAL_COMMUNITY)
Admission: RE | Admit: 2015-12-15 | Discharge: 2015-12-15 | Disposition: A | Discharge status: Home / Self Care | Payer: BLUE CROSS/BLUE SHIELD | Source: Ambulatory Visit | Attending: Internal Medicine | Admitting: Internal Medicine

## 2015-12-15 DIAGNOSIS — R05 Cough: Secondary | ICD-10-CM | POA: Insufficient documentation

## 2015-12-15 DIAGNOSIS — R059 Cough, unspecified: Secondary | ICD-10-CM

## 2015-12-15 DIAGNOSIS — I517 Cardiomegaly: Secondary | ICD-10-CM | POA: Insufficient documentation

## 2016-06-01 ENCOUNTER — Other Ambulatory Visit (HOSPITAL_COMMUNITY)
Admission: RE | Admit: 2016-06-01 | Discharge: 2016-06-01 | Disposition: A | Discharge status: Home / Self Care | Payer: BLUE CROSS/BLUE SHIELD | Source: Ambulatory Visit | Attending: Internal Medicine | Admitting: Internal Medicine

## 2016-06-01 DIAGNOSIS — E119 Type 2 diabetes mellitus without complications: Secondary | ICD-10-CM | POA: Insufficient documentation

## 2016-06-01 DIAGNOSIS — N529 Male erectile dysfunction, unspecified: Secondary | ICD-10-CM | POA: Insufficient documentation

## 2016-06-01 DIAGNOSIS — Z79899 Other long term (current) drug therapy: Secondary | ICD-10-CM | POA: Insufficient documentation

## 2016-06-01 DIAGNOSIS — Z125 Encounter for screening for malignant neoplasm of prostate: Secondary | ICD-10-CM | POA: Diagnosis not present

## 2016-06-01 DIAGNOSIS — I10 Essential (primary) hypertension: Secondary | ICD-10-CM | POA: Diagnosis not present

## 2016-06-01 DIAGNOSIS — E785 Hyperlipidemia, unspecified: Secondary | ICD-10-CM | POA: Diagnosis not present

## 2016-06-01 LAB — LIPID PANEL
CHOLESTEROL: 140 mg/dL (ref 0–200)
HDL: 38 mg/dL — ABNORMAL LOW (ref >40)
LDL Cholesterol: 85 mg/dL (ref 0–99)
TRIGLYCERIDES: 84 mg/dL (ref <150)
Total CHOL/HDL Ratio: 3.7 RATIO
VLDL: 17 mg/dL (ref 0–40)

## 2016-06-01 LAB — COMPREHENSIVE METABOLIC PANEL
ALK PHOS: 68 U/L (ref 38–126)
ALT: 24 U/L (ref 17–63)
AST: 19 U/L (ref 15–41)
Albumin: 4.3 g/dL (ref 3.5–5.0)
Anion gap: 9 (ref 5–15)
BILIRUBIN TOTAL: 0.7 mg/dL (ref 0.3–1.2)
BUN: 14 mg/dL (ref 6–20)
CALCIUM: 8.8 mg/dL — AB (ref 8.9–10.3)
CO2: 25 mmol/L (ref 22–32)
CREATININE: 0.66 mg/dL (ref 0.61–1.24)
Chloride: 104 mmol/L (ref 101–111)
Glucose, Bld: 105 mg/dL — ABNORMAL HIGH (ref 65–99)
Potassium: 3.7 mmol/L (ref 3.5–5.1)
Sodium: 138 mmol/L (ref 135–145)
TOTAL PROTEIN: 7.1 g/dL (ref 6.5–8.1)

## 2016-06-01 LAB — CBC
HEMATOCRIT: 42.5 % (ref 39.0–52.0)
Hemoglobin: 14.5 g/dL (ref 13.0–17.0)
MCH: 28.8 pg (ref 26.0–34.0)
MCHC: 34.1 g/dL (ref 30.0–36.0)
MCV: 84.3 fL (ref 78.0–100.0)
PLATELETS: 233 10*3/uL (ref 150–400)
RBC: 5.04 MIL/uL (ref 4.22–5.81)
RDW: 12.6 % (ref 11.5–15.5)
WBC: 7.3 10*3/uL (ref 4.0–10.5)

## 2016-06-01 LAB — PSA: PSA: 2.55 ng/mL (ref 0.00–4.00)

## 2016-06-02 LAB — HEMOGLOBIN A1C
Hgb A1c MFr Bld: 5.8 % — ABNORMAL HIGH (ref 4.8–5.6)
Mean Plasma Glucose: 120 mg/dL

## 2016-06-02 LAB — MICROALBUMIN, URINE: MICROALB UR: 4.3 ug/mL — AB

## 2016-10-05 ENCOUNTER — Ambulatory Visit (INDEPENDENT_AMBULATORY_CARE_PROVIDER_SITE_OTHER): Payer: BLUE CROSS/BLUE SHIELD | Admitting: Pulmonary Disease

## 2016-10-05 ENCOUNTER — Encounter: Payer: Self-pay | Admitting: Pulmonary Disease

## 2016-10-05 VITALS — BP 116/76 | HR 71 | Ht 69.0 in | Wt 227.8 lb

## 2016-10-05 DIAGNOSIS — Z9989 Dependence on other enabling machines and devices: Secondary | ICD-10-CM | POA: Diagnosis not present

## 2016-10-05 DIAGNOSIS — G4733 Obstructive sleep apnea (adult) (pediatric): Secondary | ICD-10-CM | POA: Diagnosis not present

## 2016-10-05 NOTE — Patient Instructions (Signed)
CPAP supplies will be renewed for a year including new mask and filters Call us if his machine starts giving him problems  We will check a report on your machine and calibrate settings if needed

## 2016-10-05 NOTE — Assessment & Plan Note (Signed)
CPAP supplies will be renewed for a year including new mask and filters Call us if his machine starts giving him problems  We will check a report on your machine and calibrate settings if needed  Weight loss encouraged, compliance with goal of at least 4-6 hrs every night is the expectation. Advised against medications with sedative side effects Cautioned against driving when sleepy - understanding that sleepiness will vary on a day to day basis

## 2016-10-05 NOTE — Progress Notes (Signed)
   Subjective:    Patient ID: Carl [name], male    DOB: 05/08/1956, 61 y.o.   MRN: [id number]  HPI    Review of Systems  Constitutional: Negative for fever and unexpected weight change.  HENT: Negative for congestion, dental problem, ear pain, nosebleeds, postnasal drip, rhinorrhea, sinus pressure, sneezing, sore throat and trouble swallowing.   Eyes: Negative for redness and itching.  Respiratory: Positive for cough. Negative for chest tightness, shortness of breath and wheezing.   Cardiovascular: Negative for palpitations and leg swelling.  Gastrointestinal: Negative for nausea and vomiting.  Genitourinary: Negative for dysuria.  Musculoskeletal: Negative for joint swelling.  Skin: Negative for rash.  Neurological: Negative for headaches.  Hematological: Does not bruise/bleed easily.  Psychiatric/Behavioral: Negative for dysphoric mood. The patient is not nervous/anxious.        Objective:   Physical Exam        Assessment & Plan:

## 2016-10-05 NOTE — Progress Notes (Signed)
Subjective:    Patient ID: Carl [name], male    DOB: 01-13-1956, 61 y.o.   MRN: [id number]  HPI  Chief Complaint  Patient presents with  . Pulm Consult    OSA. Currently has a CPAP machine through [facility]. Pt. states he has been sleeping well with machine. Can't sleep without it. Denies any SOB or chest pain.      61 year old who presents to reestablish for  management of obstructive sleep apnea.  PSG in 2/99 showed severe OSA with RDI 73/hour. This was corrected by CPAP of 11 cm and he has been maintained on the setting since then. He was last seen in 2012 when he obtained a new machine and this machine seems to be working well for him. He uses a nasal mask. Wife accompanies him today and denies any snoring or excessive daytime somnolence during the day. Bedtime is around 10:30 PM, he sleeps on his back or his left side, sleep latency is minimal, reports one to 2 nocturnal awakenings including nocturia and is out of bed by late as 7 AM without dryness of mouth or headaches. He likes the humidifier setting on his machine. He denies excessive abstinent daytime and has a retired lifestyle  There is no history suggestive of cataplexy, sleep paralysis or parasomnias  He has heterozygous factor V Leyden deficiency and takes an aspirin daily     Past Medical History:  Diagnosis Date  . Carpal tunnel syndrome    left  . Diabetes mellitus    diet controlled  . Factor 5 Leiden mutation, heterozygous ([place])   . Hypertension   . OSA (obstructive sleep apnea)    wears CPAP nightly   Past Surgical History:  Procedure Laterality Date  . ANKLE SURGERY     Left  . CARPAL TUNNEL RELEASE Left 04/08/2015   Procedure: LEFT CARPAL TUNNEL RELEASE;  Surgeon: Daryll Brod, MD;  Location: [place];  Service: Orthopedics;  Laterality: Left;  . CARPAL TUNNEL RELEASE Right 05/06/2015   Procedure: RIGHT CARPAL TUNNEL RELEASE;  Surgeon: Daryll Brod, MD;  Location: [place];   Service: Orthopedics;  Laterality: Right;  ANESTHESIA:  IV REGIONAL FAB  . HERNIA REPAIR    . INSERTION OF MESH N/A 04/10/2014   Procedure: INSERTION OF MESH;  Surgeon: Jamesetta So, MD;  Location: [facility];  Service: General;  Laterality: N/A;  . UMBILICAL HERNIA REPAIR N/A 04/10/2014   Procedure: HERNIA REPAIR UMBILICAL ADULT;  Surgeon: Jamesetta So, MD;  Location: [facility];  Service: General;  Laterality: N/A;     Allergies  Allergen Reactions  . Amoxicillin     Social History   Social History  . Marital status: Married    Spouse name: Nevin Bloodgood  . Number of children: Y  . Years of education: N/A   Occupational History  . med technologist    Social History Main Topics  . Smoking status: Never Smoker  . Smokeless tobacco: Not on file  . Alcohol use Yes     Comment: occ  . Drug use: No  . Sexual activity: Yes   Other Topics Concern  . Not on file   Social History Narrative  . No narrative on file     Family History  Problem Relation Age of Onset  . Heart disease Mother   . Clotting disorder Father   . Rheum arthritis Sister   . Rheum arthritis Mother      Review of Systems  neg for any significant sore throat, dysphagia, itching, sneezing, nasal congestion or excess/ purulent secretions, fever, chills, sweats, unintended wt loss, pleuritic or exertional cp, hempoptysis, orthopnea pnd or change in chronic leg swelling. Also denies presyncope, palpitations, heartburn, abdominal pain, nausea, vomiting, diarrhea or change in bowel or urinary habits, dysuria,hematuria, rash, arthralgias, visual complaints, headache, numbness weakness or ataxia.     Objective:   Physical Exam  Gen. Pleasant, obese, in no distress ENT - no lesions, no post nasal drip Neck: No JVD, no thyromegaly, no carotid bruits Lungs: no use of accessory muscles, no dullness to percussion, decreased without rales or rhonchi  Cardiovascular: Rhythm regular, heart sounds  normal, no murmurs or  gallops, no peripheral edema Musculoskeletal: No deformities, no cyanosis or clubbing , no tremors       Assessment & Plan:

## 2016-10-06 ENCOUNTER — Encounter: Payer: Self-pay | Admitting: Pulmonary Disease

## 2016-10-17 ENCOUNTER — Telehealth: Payer: Self-pay | Admitting: Pulmonary Disease

## 2016-10-17 NOTE — Telephone Encounter (Signed)
Pl send him letter

## 2016-10-17 NOTE — Telephone Encounter (Signed)
-----   Message from Darlina Guys sent at 10/13/2016  2:36 PM EST ----- Good afternoon. We have not been able to make contact with this patient to schedule his CPAP set up appointment.  We have tried all three phone numbers listed in his Epic chart.  Please let me know if there is anything else that we can do to assist.  Thank you [place]

## 2016-10-19 NOTE — Telephone Encounter (Signed)
Letter has been sent to patient's mychart and has placed in the outgoing mailbox.

## 2016-12-13 ENCOUNTER — Other Ambulatory Visit (HOSPITAL_COMMUNITY)
Admission: RE | Admit: 2016-12-13 | Discharge: 2016-12-13 | Disposition: A | Discharge status: Home / Self Care | Payer: BLUE CROSS/BLUE SHIELD | Source: Ambulatory Visit | Attending: Internal Medicine | Admitting: Internal Medicine

## 2016-12-13 DIAGNOSIS — E119 Type 2 diabetes mellitus without complications: Secondary | ICD-10-CM | POA: Diagnosis present

## 2016-12-14 LAB — HEMOGLOBIN A1C
HEMOGLOBIN A1C: 5.5 % (ref 4.8–5.6)
MEAN PLASMA GLUCOSE: 111 mg/dL

## 2017-06-07 ENCOUNTER — Other Ambulatory Visit (HOSPITAL_COMMUNITY)
Admission: RE | Admit: 2017-06-07 | Discharge: 2017-06-07 | Disposition: A | Discharge status: Home / Self Care | Payer: BLUE CROSS/BLUE SHIELD | Source: Ambulatory Visit | Attending: Internal Medicine | Admitting: Internal Medicine

## 2017-06-07 DIAGNOSIS — N529 Male erectile dysfunction, unspecified: Secondary | ICD-10-CM | POA: Diagnosis not present

## 2017-06-07 DIAGNOSIS — E119 Type 2 diabetes mellitus without complications: Secondary | ICD-10-CM | POA: Insufficient documentation

## 2017-06-07 DIAGNOSIS — I10 Essential (primary) hypertension: Secondary | ICD-10-CM | POA: Diagnosis present

## 2017-06-07 DIAGNOSIS — E785 Hyperlipidemia, unspecified: Secondary | ICD-10-CM | POA: Diagnosis not present

## 2017-06-07 DIAGNOSIS — Z125 Encounter for screening for malignant neoplasm of prostate: Secondary | ICD-10-CM | POA: Diagnosis not present

## 2017-06-07 DIAGNOSIS — Z79899 Other long term (current) drug therapy: Secondary | ICD-10-CM | POA: Diagnosis not present

## 2017-06-07 LAB — CBC
HEMATOCRIT: 43.2 % (ref 39.0–52.0)
Hemoglobin: 14.7 g/dL (ref 13.0–17.0)
MCH: 28.8 pg (ref 26.0–34.0)
MCHC: 34 g/dL (ref 30.0–36.0)
MCV: 84.5 fL (ref 78.0–100.0)
Platelets: 223 10*3/uL (ref 150–400)
RBC: 5.11 MIL/uL (ref 4.22–5.81)
RDW: 12.7 % (ref 11.5–15.5)
WBC: 6.1 10*3/uL (ref 4.0–10.5)

## 2017-06-07 LAB — COMPREHENSIVE METABOLIC PANEL
ALT: 27 U/L (ref 17–63)
ANION GAP: 8 (ref 5–15)
AST: 22 U/L (ref 15–41)
Albumin: 4.1 g/dL (ref 3.5–5.0)
Alkaline Phosphatase: 64 U/L (ref 38–126)
BILIRUBIN TOTAL: 0.6 mg/dL (ref 0.3–1.2)
BUN: 11 mg/dL (ref 6–20)
CALCIUM: 8.8 mg/dL — AB (ref 8.9–10.3)
CO2: 25 mmol/L (ref 22–32)
Chloride: 102 mmol/L (ref 101–111)
Creatinine, Ser: 0.65 mg/dL (ref 0.61–1.24)
GFR calc Af Amer: >60 mL/min (ref >60)
Glucose, Bld: 104 mg/dL — ABNORMAL HIGH (ref 65–99)
POTASSIUM: 3.7 mmol/L (ref 3.5–5.1)
Sodium: 135 mmol/L (ref 135–145)
TOTAL PROTEIN: 7.1 g/dL (ref 6.5–8.1)

## 2017-06-07 LAB — LIPID PANEL
Cholesterol: 184 mg/dL (ref 0–200)
HDL: 38 mg/dL — AB (ref >40)
LDL Cholesterol: 122 mg/dL — ABNORMAL HIGH (ref 0–99)
TRIGLYCERIDES: 118 mg/dL (ref <150)
Total CHOL/HDL Ratio: 4.8 RATIO
VLDL: 24 mg/dL (ref 0–40)

## 2017-06-07 LAB — HEMOGLOBIN A1C
Hgb A1c MFr Bld: 5.5 % (ref 4.8–5.6)
Mean Plasma Glucose: 111.15 mg/dL

## 2017-06-07 LAB — PSA: Prostatic Specific Antigen: 1.91 ng/mL (ref 0.00–4.00)

## 2017-06-08 LAB — MICROALBUMIN / CREATININE URINE RATIO
CREATININE, UR: 163.1 mg/dL
MICROALB/CREAT RATIO: 5.2 mg/g{creat} (ref 0.0–30.0)
Microalb, Ur: 8.4 ug/mL — ABNORMAL HIGH

## 2017-12-01 ENCOUNTER — Ambulatory Visit (INDEPENDENT_AMBULATORY_CARE_PROVIDER_SITE_OTHER): Payer: BLUE CROSS/BLUE SHIELD | Admitting: Adult Health

## 2017-12-01 ENCOUNTER — Encounter: Payer: Self-pay | Admitting: Adult Health

## 2017-12-01 DIAGNOSIS — G4733 Obstructive sleep apnea (adult) (pediatric): Secondary | ICD-10-CM

## 2017-12-01 DIAGNOSIS — Z9989 Dependence on other enabling machines and devices: Secondary | ICD-10-CM | POA: Diagnosis not present

## 2017-12-01 NOTE — Assessment & Plan Note (Signed)
Wt loss  

## 2017-12-01 NOTE — Patient Instructions (Signed)
Continue on CPAP at bedtime Work on healthy weight Do not drive a sleepy Follow-up in 1 year and as needed with Dr. Elsworth Soho

## 2017-12-01 NOTE — Progress Notes (Signed)
@Patient  ID: Carl [name], male    DOB: [birth year], 62 y.o.   MRN: [id number]  Chief Complaint  Patient presents with  . Follow-up    OSA    Referring provider: [name], MD  HPI: 62 year old male followed for severe sleep apnea on CPAP at bedtime  TEST  PSG in 2/99 showed severe OSA with RDI 73/hour. This was corrected by CPAP of 11 cm   12/01/2017 Follow up ; OSA  Patient presents for a yearly follow-up for sleep apnea.  Patient says he is doing very well on CPAP at bedtime.  He wears it each night.  He feels rested with no significant daytime sleepiness.  Download shows excellent compliance with average usage at 7.5 hours.  Patient is on CPAP 10 cm  H2O.  AHI is 1.8.  Minimum leaks.  Discussed weight loss   Allergies  Allergen Reactions  . Amoxicillin     Immunization History  Administered Date(s) Administered  . Influenza Split 06/27/2017    Past Medical History:  Diagnosis Date  . Carpal tunnel syndrome    left  . Diabetes mellitus    diet controlled  . Factor 5 Leiden mutation, heterozygous (Carl [name])   . Hypertension   . OSA (obstructive sleep apnea)    wears CPAP nightly    Tobacco History: Social History   Tobacco Use  Smoking Status Never Smoker  Smokeless Tobacco Never Used   Counseling given: Not Answered   Outpatient Encounter Medications as of 12/01/2017  Medication Sig  . amLODipine-benazepril (LOTREL) 5-20 MG per capsule Take 1 capsule by mouth 2 (two) times daily.   Marland Kitchen aspirin 325 MG tablet Take 325 mg by mouth daily.  Marland Kitchen oxymetazoline (AFRIN) 0.05 % nasal spray Place 2 sprays into the nose at bedtime.  . pravastatin (PRAVACHOL) 20 MG tablet Take 20 mg by mouth daily.     No facility-administered encounter medications on file as of 12/01/2017.      Review of Systems  Constitutional:   No  weight loss, night sweats,  Fevers, chills, fatigue, or  lassitude.  HEENT:   No headaches,  Difficulty swallowing,  Tooth/dental problems, or  Sore  throat,                No sneezing, itching, ear ache, nasal congestion, post nasal drip,   CV:  No chest pain,  Orthopnea, PND, swelling in lower extremities, anasarca, dizziness, palpitations, syncope.   GI  No heartburn, indigestion, abdominal pain, nausea, vomiting, diarrhea, change in bowel habits, loss of appetite, bloody stools.   Resp: No shortness of breath with exertion or at rest.  No excess mucus, no productive cough,  No non-productive cough,  No coughing up of blood.  No change in color of mucus.  No wheezing.  No chest wall deformity  Skin: no rash or lesions.  GU: no dysuria, change in color of urine, no urgency or frequency.  No flank pain, no hematuria   MS:  No joint pain or swelling.  No decreased range of motion.  No back pain.    Physical Exam  BP 130/82 (BP Location: Right Arm, Cuff Size: Normal)   Pulse 76   Ht 5\' 9"  (1.753 m)   Wt 230 lb 6.4 oz (104.5 kg)   SpO2 97%   BMI 34.02 kg/m   GEN: A/Ox3; pleasant , NAD, obese    HEENT:  [place]/AT,  EACs-clear, TMs-wnl, NOSE-clear, THROAT-clear, no lesions, no postnasal drip or exudate noted. Class  2 MP airway   NECK:  Supple w/ fair ROM; no JVD; normal carotid impulses w/o bruits; no thyromegaly or nodules palpated; no lymphadenopathy.    RESP  Clear  P & A; w/o, wheezes/ rales/ or rhonchi. no accessory muscle use, no dullness to percussion  CARD:  RRR, no m/r/g, no peripheral edema, pulses intact, no cyanosis or clubbing.  GI:   Soft & nt; nml bowel sounds; no organomegaly or masses detected.   Musco: Warm bil, no deformities or joint swelling noted.   Neuro: alert, no focal deficits noted.    Skin: Warm, no lesions or rashes    Lab Results:  CBC    Component Value Date/Time   WBC 6.1 06/07/2017 0835   RBC 5.11 06/07/2017 0835   HGB 14.7 06/07/2017 0835   HCT 43.2 06/07/2017 0835   PLT 223 06/07/2017 0835   MCV 84.5 06/07/2017 0835   MCH 28.8 06/07/2017 0835   MCHC 34.0 06/07/2017 0835   RDW  12.7 06/07/2017 0835   LYMPHSABS 2.2 04/04/2014 0830   MONOABS 0.7 04/04/2014 0830   EOSABS 0.2 04/04/2014 0830   BASOSABS 0.0 04/04/2014 0830    BMET    Component Value Date/Time   NA 135 06/07/2017 0835   K 3.7 06/07/2017 0835   CL 102 06/07/2017 0835   CO2 25 06/07/2017 0835   GLUCOSE 104 (H) 06/07/2017 0835   BUN 11 06/07/2017 0835   CREATININE 0.65 06/07/2017 0835   CALCIUM 8.8 (L) 06/07/2017 0835   GFRNONAA >60 06/07/2017 0835   GFRAA >60 06/07/2017 0835    BNP No results found for: BNP  ProBNP No results found for: PROBNP  Imaging: No results found.   Assessment & Plan:   OSA on CPAP Doing very well on CPAP with excellent control and compliance   Plan  Patient Instructions  Continue on CPAP at bedtime Work on healthy weight Do not drive a sleepy Follow-up in 1 year and as needed with Dr. Elsworth Soho       Morbid obesity [facility]) Wt loss      Carl Edison, NP 12/01/2017

## 2017-12-01 NOTE — Assessment & Plan Note (Signed)
Doing very well on CPAP with excellent control and compliance   Plan  Patient Instructions  Continue on CPAP at bedtime Work on healthy weight Do not drive a sleepy Follow-up in 1 year and as needed with Dr. Elsworth Soho

## 2017-12-08 ENCOUNTER — Other Ambulatory Visit (HOSPITAL_COMMUNITY)
Admission: RE | Admit: 2017-12-08 | Discharge: 2017-12-08 | Disposition: A | Discharge status: Home / Self Care | Payer: BLUE CROSS/BLUE SHIELD | Source: Ambulatory Visit | Attending: Internal Medicine | Admitting: Internal Medicine

## 2017-12-08 DIAGNOSIS — R002 Palpitations: Secondary | ICD-10-CM | POA: Diagnosis not present

## 2017-12-08 DIAGNOSIS — E119 Type 2 diabetes mellitus without complications: Secondary | ICD-10-CM | POA: Insufficient documentation

## 2017-12-08 LAB — HEMOGLOBIN A1C
Hgb A1c MFr Bld: 5.5 % (ref 4.8–5.6)
Mean Plasma Glucose: 111.15 mg/dL

## 2017-12-08 LAB — TSH: TSH: 1.84 u[IU]/mL (ref 0.350–4.500)

## 2017-12-15 ENCOUNTER — Encounter: Payer: Self-pay | Admitting: Gastroenterology

## 2018-02-06 ENCOUNTER — Other Ambulatory Visit: Payer: Self-pay

## 2018-02-06 ENCOUNTER — Ambulatory Visit: Payer: BLUE CROSS/BLUE SHIELD | Admitting: Gastroenterology

## 2018-02-06 ENCOUNTER — Encounter: Payer: Self-pay | Admitting: Gastroenterology

## 2018-02-06 DIAGNOSIS — R1310 Dysphagia, unspecified: Secondary | ICD-10-CM | POA: Insufficient documentation

## 2018-02-06 DIAGNOSIS — R1319 Other dysphagia: Secondary | ICD-10-CM

## 2018-02-06 DIAGNOSIS — Z1211 Encounter for screening for malignant neoplasm of colon: Secondary | ICD-10-CM | POA: Insufficient documentation

## 2018-02-06 NOTE — Patient Instructions (Signed)
Called [employer], no PA needed for EGD/DIL. Ref# [id number].

## 2018-02-06 NOTE — Progress Notes (Signed)
Primary Care Physician:  [name], MD  Primary Gastroenterologist:  Barney Drain, MD   Chief Complaint  Patient presents with  . Dysphagia    HPI:  Carl [name] is a 62 y.o. male here at the request of Dr. Willey Blade for dysphagia.  Patient complains of solid food dysphagia specifically bread and steak.  When food gets stuck he has to stop eating, he stretches his chest out to try to get it to go down.  So far food will eventually gone down.  Denies any vomiting.  No abdominal pain.  No heartburn.  Bowel movements are regular.  No blood in the stool or melena.  Patient never had follow-up colonoscopy in 2016 at [facility] as recommended.  Patient gives a history of H. pylori in the remote past status post treatment.  Current Outpatient Medications  Medication Sig Dispense Refill  . amLODipine-benazepril (LOTREL) 10-40 MG capsule daily.    Marland Kitchen aspirin 325 MG tablet Take 325 mg by mouth daily.    Marland Kitchen oxymetazoline (AFRIN) 0.05 % nasal spray Place 2 sprays into the nose at bedtime.     No current facility-administered medications for this visit.     Allergies as of 02/06/2018 - Review Complete 02/06/2018  Allergen Reaction Noted  . Amoxicillin  04/04/2014    Past Medical History:  Diagnosis Date  . Carpal tunnel syndrome    left  . Diabetes mellitus    diet controlled  . Factor 5 Leiden mutation, heterozygous ([place])   . Hyperlipidemia   . Hypertension   . OSA (obstructive sleep apnea)    wears CPAP nightly    Past Surgical History:  Procedure Laterality Date  . ANKLE SURGERY  1996   Left  . CARPAL TUNNEL RELEASE Left 04/08/2015   Procedure: LEFT CARPAL TUNNEL RELEASE;  Surgeon: Daryll Brod, MD;  Location: [place];  Service: Orthopedics;  Laterality: Left;  . CARPAL TUNNEL RELEASE Right 05/06/2015   Procedure: RIGHT CARPAL TUNNEL RELEASE;  Surgeon: Daryll Brod, MD;  Location: [place];  Service: Orthopedics;   Laterality: Right;  ANESTHESIA:  IV REGIONAL FAB  . COLONOSCOPY  02/2010   Dr. Oneida Alar: Extremely redundant and tortuous sigmoid and transverse colon.  Normal ileocecal valve, cecum could not be intubated.  Noted to have diverticulosis.  Advised to have follow-up screening colonoscopy in 5 years at [facility] overtube and fluoroscopy.  Marland Kitchen HERNIA REPAIR    . INSERTION OF MESH N/A 04/10/2014   Procedure: INSERTION OF MESH;  Surgeon: Jamesetta So, MD;  Location: [facility];  Service: General;  Laterality: N/A;  . UMBILICAL HERNIA REPAIR N/A 04/10/2014   Procedure: HERNIA REPAIR UMBILICAL ADULT;  Surgeon: Jamesetta So, MD;  Location: [facility];  Service: General;  Laterality: N/A;    Family History  Problem Relation Age of Onset  . Heart disease Mother   . Rheum arthritis Mother   . Clotting disorder Father   . Rheum arthritis Sister   . Colon cancer Neg Hx     Social History   Socioeconomic History  . Marital status: Married    Spouse name: Nevin Bloodgood  . Number of children: Y  . Years of education: Not on file  . Highest education level: Not on file  Occupational History  . Occupation: med Editor, commissioning  Social Needs  . Financial resource strain: Not on file  . Food insecurity:    Worry: Not on file  Inability: Not on file  . Transportation needs:    Medical: Not on file    Non-medical: Not on file  Tobacco Use  . Smoking status: Never Smoker  . Smokeless tobacco: Never Used  Substance and Sexual Activity  . Alcohol use: Yes    Comment: occ  . Drug use: No  . Sexual activity: Yes  Lifestyle  . Physical activity:    Days per week: Not on file    Minutes per session: Not on file  . Stress: Not on file  Relationships  . Social connections:    Talks on phone: Not on file    Gets together: Not on file    Attends religious service: Not on file    Active member of club or organization: Not on file    Attends meetings of clubs or organizations: Not  on file    Relationship status: Not on file  . Intimate partner violence:    Fear of current or ex partner: Not on file    Emotionally abused: Not on file    Physically abused: Not on file    Forced sexual activity: Not on file  Other Topics Concern  . Not on file  Social History Narrative  . Not on file      ROS:  General: Negative for anorexia, weight loss, fever, chills, fatigue, weakness. Eyes: Negative for vision changes.  ENT: Negative for hoarseness, nasal congestion. CV: Negative for chest pain, angina, palpitations, dyspnea on exertion, peripheral edema.  Respiratory: Negative for dyspnea at rest, dyspnea on exertion, cough, sputum, wheezing.  GI: See history of present illness. GU:  Negative for dysuria, hematuria, urinary incontinence, urinary frequency, nocturnal urination.  MS: Negative for joint pain, low back pain.  Derm: Negative for rash or itching.  Neuro: Negative for weakness, abnormal sensation, seizure, frequent headaches, memory loss, confusion.  Psych: Negative for anxiety, depression, suicidal ideation, hallucinations.  Endo: Negative for unusual weight change.  Heme: Negative for bruising or bleeding. Allergy: Negative for rash or hives.    Physical Examination:  BP 130/78   Pulse (!) 57   Temp 97.8 F (36.6 C) (Oral)   Ht 5\' 9"  (1.753 m)   Wt 226 lb 12.8 oz (102.9 kg)   BMI 33.49 kg/m    General: Well-nourished, well-developed in no acute distress.  Head: Normocephalic, atraumatic.   Eyes: Conjunctiva pink, no icterus. Mouth: Oropharyngeal mucosa moist and pink , no lesions erythema or exudate. Neck: Supple without thyromegaly, masses, or lymphadenopathy.  Lungs: Clear to auscultation bilaterally.  Heart: Regular rate and rhythm, no murmurs rubs or gallops.  Abdomen: Bowel sounds are normal, nontender, nondistended, no hepatosplenomegaly or masses, no abdominal bruits or    hernia , no rebound or guarding.   Rectal: not  performed Extremities: No lower extremity edema. No clubbing or deformities.  Neuro: Alert and oriented x 4 , grossly normal neurologically.  Skin: Warm and dry, no rash or jaundice.   Psych: Alert and cooperative, normal mood and affect.  Labs: Lab Results  Component Value Date   HGBA1C 5.5 12/08/2017   Lab Results  Component Value Date   CREATININE 0.65 06/07/2017   BUN 11 06/07/2017   NA 135 06/07/2017   K 3.7 06/07/2017   CL 102 06/07/2017   CO2 25 06/07/2017   Lab Results  Component Value Date   TSH 1.840 12/08/2017   Lab Results  Component Value Date   WBC 6.1 06/07/2017   HGB 14.7 06/07/2017  HCT 43.2 06/07/2017   MCV 84.5 06/07/2017   PLT 223 06/07/2017   Lab Results  Component Value Date   ALT 27 06/07/2017   AST 22 06/07/2017   ALKPHOS 64 06/07/2017   BILITOT 0.6 06/07/2017     Imaging Studies: No results found.

## 2018-02-06 NOTE — Patient Instructions (Signed)
1. Upper endoscopy as scheduled. See separate instructions.  2. Referral to [facility] for colonoscopy. If you have not heard from them within 2 weeks, please contact our office.

## 2018-02-06 NOTE — Assessment & Plan Note (Signed)
Incomplete colonoscopy in June 2011, ileocecal valve was seen but cecum could not be intubated.  He had a extremely redundant and tortuous sigmoid and transverse colon..  Advised to have follow-up colonoscopy at [facility] in 2016.  This is not been done.  We will make a referral at this time.

## 2018-02-06 NOTE — Progress Notes (Signed)
cc'd to pcp 

## 2018-02-06 NOTE — Assessment & Plan Note (Signed)
Very pleasant 62 year old gentleman with greater than 1 year history of solid food dysphagia.  Particularly has problems with bread and meat.  Suspect esophageal stricture.  He denies any typical GERD symptoms.  Remote history of H pylori status post treatment.  Never checked for eradication.  Plan for EGD with esophageal dilation in the near future.  I have discussed the risks, alternatives, benefits with regards to but not limited to the risk of reaction to medication, bleeding, infection, perforation and the patient is agreeable to proceed. Written consent to be obtained.

## 2018-02-21 ENCOUNTER — Telehealth: Payer: Self-pay

## 2018-02-21 NOTE — Telephone Encounter (Signed)
[facility] called office and LMOVM, they have been unable to schedule TCS after multiple attempts to reach pt.  Tried to call pt, no answer, LMOVM for him to call [facility] also mailed.

## 2018-02-28 NOTE — Telephone Encounter (Signed)
Called [facility], TCS scheduled for 03/16/18 at 9:00am with Dr. Lynita Lombard.

## 2018-03-01 ENCOUNTER — Telehealth: Payer: Self-pay | Admitting: *Deleted

## 2018-03-01 NOTE — Telephone Encounter (Signed)
Called spoke with patient and is aware procedure time on 03/09/18 will now be 3:00pm, arrival time is 2:00pm. Patient aware that day he can have clear liquids until 11:00am. Nothing after that point. He voiced understanding. Hoyle Sauer is aware of the change

## 2018-03-08 ENCOUNTER — Telehealth: Payer: Self-pay

## 2018-03-08 NOTE — Telephone Encounter (Signed)
Pt called office. EGD/DIL moved to 10:15am tomorrow, arrive at 9:15am. Advised to be NPO after midnight. Informed endo scheduler.

## 2018-03-08 NOTE — Telephone Encounter (Signed)
Tried to call pt to see if he can arrive earlier tomorrow for EGD/DIL d/t a cancellation, no answer, LMOVM.

## 2018-03-09 ENCOUNTER — Ambulatory Visit (HOSPITAL_COMMUNITY)
Admission: RE | Admit: 2018-03-09 | Discharge: 2018-03-09 | Disposition: A | Discharge status: Home Health | Payer: BLUE CROSS/BLUE SHIELD | Source: Ambulatory Visit | Attending: Gastroenterology | Admitting: Gastroenterology

## 2018-03-09 ENCOUNTER — Other Ambulatory Visit: Payer: Self-pay

## 2018-03-09 ENCOUNTER — Encounter (HOSPITAL_COMMUNITY): Payer: Self-pay | Admitting: *Deleted

## 2018-03-09 ENCOUNTER — Encounter (HOSPITAL_COMMUNITY)
Admission: RE | Disposition: A | Discharge status: Home Health | Payer: Self-pay | Source: Ambulatory Visit | Attending: Gastroenterology

## 2018-03-09 DIAGNOSIS — K2970 Gastritis, unspecified, without bleeding: Secondary | ICD-10-CM

## 2018-03-09 DIAGNOSIS — K2950 Unspecified chronic gastritis without bleeding: Secondary | ICD-10-CM | POA: Insufficient documentation

## 2018-03-09 DIAGNOSIS — G4733 Obstructive sleep apnea (adult) (pediatric): Secondary | ICD-10-CM | POA: Diagnosis not present

## 2018-03-09 DIAGNOSIS — E119 Type 2 diabetes mellitus without complications: Secondary | ICD-10-CM | POA: Insufficient documentation

## 2018-03-09 DIAGNOSIS — Z7982 Long term (current) use of aspirin: Secondary | ICD-10-CM | POA: Diagnosis not present

## 2018-03-09 DIAGNOSIS — K222 Esophageal obstruction: Secondary | ICD-10-CM | POA: Diagnosis not present

## 2018-03-09 DIAGNOSIS — T39395A Adverse effect of other nonsteroidal anti-inflammatory drugs [NSAID], initial encounter: Secondary | ICD-10-CM

## 2018-03-09 DIAGNOSIS — K2960 Other gastritis without bleeding: Secondary | ICD-10-CM

## 2018-03-09 DIAGNOSIS — K219 Gastro-esophageal reflux disease without esophagitis: Secondary | ICD-10-CM | POA: Insufficient documentation

## 2018-03-09 DIAGNOSIS — Z79899 Other long term (current) drug therapy: Secondary | ICD-10-CM | POA: Insufficient documentation

## 2018-03-09 DIAGNOSIS — I10 Essential (primary) hypertension: Secondary | ICD-10-CM | POA: Diagnosis not present

## 2018-03-09 DIAGNOSIS — R1310 Dysphagia, unspecified: Secondary | ICD-10-CM | POA: Diagnosis not present

## 2018-03-09 DIAGNOSIS — K449 Diaphragmatic hernia without obstruction or gangrene: Secondary | ICD-10-CM | POA: Insufficient documentation

## 2018-03-09 DIAGNOSIS — D6851 Activated protein C resistance: Secondary | ICD-10-CM | POA: Diagnosis not present

## 2018-03-09 DIAGNOSIS — R1319 Other dysphagia: Secondary | ICD-10-CM

## 2018-03-09 DIAGNOSIS — Z9989 Dependence on other enabling machines and devices: Secondary | ICD-10-CM | POA: Diagnosis not present

## 2018-03-09 HISTORY — PX: ESOPHAGOGASTRODUODENOSCOPY: SHX5428

## 2018-03-09 HISTORY — PX: SAVORY DILATION: SHX5439

## 2018-03-09 LAB — GLUCOSE, CAPILLARY: Glucose-Capillary: 112 mg/dL — ABNORMAL HIGH (ref 65–99)

## 2018-03-09 SURGERY — EGD (ESOPHAGOGASTRODUODENOSCOPY)
Anesthesia: Moderate Sedation

## 2018-03-09 MED ORDER — LIDOCAINE VISCOUS HCL 2 % MT SOLN
OROMUCOSAL | Status: AC
  Filled 2018-03-09: qty 15

## 2018-03-09 MED ORDER — LIDOCAINE VISCOUS HCL 2 % MT SOLN
OROMUCOSAL | Status: DC | PRN
  Administered 2018-03-09: 1 via OROMUCOSAL

## 2018-03-09 MED ORDER — MIDAZOLAM HCL 5 MG/5ML IJ SOLN
INTRAMUSCULAR | Status: AC
  Filled 2018-03-09: qty 10

## 2018-03-09 MED ORDER — MIDAZOLAM HCL 5 MG/5ML IJ SOLN
INTRAMUSCULAR | Status: DC | PRN
  Administered 2018-03-09: 1 mg via INTRAVENOUS
  Administered 2018-03-09 (×2): 2 mg via INTRAVENOUS

## 2018-03-09 MED ORDER — MINERAL OIL PO OIL
TOPICAL_OIL | ORAL | Status: AC
  Filled 2018-03-09: qty 30

## 2018-03-09 MED ORDER — PANTOPRAZOLE SODIUM 40 MG PO TBEC: DELAYED_RELEASE_TABLET | ORAL | 11 refills | Status: DC

## 2018-03-09 MED ORDER — MEPERIDINE HCL 100 MG/ML IJ SOLN
INTRAMUSCULAR | Status: AC
  Filled 2018-03-09: qty 2

## 2018-03-09 MED ORDER — SODIUM CHLORIDE 0.9 % IV SOLN
INTRAVENOUS | Status: DC
  Administered 2018-03-09: 09:00:00 via INTRAVENOUS

## 2018-03-09 MED ORDER — MEPERIDINE HCL 100 MG/ML IJ SOLN
INTRAMUSCULAR | Status: DC | PRN
  Administered 2018-03-09 (×4): 25 mg

## 2018-03-09 MED ORDER — STERILE WATER FOR IRRIGATION IR SOLN
Status: DC | PRN
  Administered 2018-03-09: 11:00:00

## 2018-03-09 NOTE — Discharge Instructions (Signed)
I dilated your esophagus DUE TO YOUR PROBLEM SWALLOWING. You have A SMALL HIATAL HERNIA, and gastritis FROM ASPIRIN/ibuprofen. I biopsied your stomach.    DRINK WATER TO KEEP YOUR URINE LIGHT YELLOW.  CONTINUE YOUR WEIGHT LOSS EFFORTS. YOUR BODY MASS INDEX IS OVER 30 WHICH MEANS YOU ARE OBESE. OBESITY IS ASSOCIATED WITH AN INCREASED FOR CIRRHOSIS AND ALL CANCERS, INCLUDING ESOPHAGEAL AND COLON CANCER. A WEIGHT OF 200 LBS OR LESS  WILL GET YOUR BODY MASS INDEX(BMI) UNDER 30.   AVOID REFLUX TRIGGERS. SEE INFO BELOW.  TO TREAT ACID REFLUX AND GASTRITIS, START PROTONIX. TAKE 30 MINUTES PRIOR TO YOUR FIRST MEAL.   YOUR BIOPSY WILL BE BACK IN 7 DAYS.   FOLLOW UP IN 4 MOS.  UPPER ENDOSCOPY AFTER CARE Read the instructions outlined below and refer to this sheet in the next week. These discharge instructions provide you with general information on caring for yourself after you leave the hospital. While your treatment has been planned according to the most current medical practices available, unavoidable complications occasionally occur. If you have any problems or questions after discharge, call DR. Erryn Dickison, [phone].  ACTIVITY  You may resume your regular activity, but move at a slower pace for the next 24 hours.   Take frequent rest periods for the next 24 hours.   Walking will help get rid of the air and reduce the bloated feeling in your belly (abdomen).   No driving for 24 hours (because of the medicine (anesthesia) used during the test).   You may shower.   Do not sign any important legal documents or operate any machinery for 24 hours (because of the anesthesia used during the test).    NUTRITION  Drink plenty of fluids.   You may resume your normal diet as instructed by your doctor.   Begin with a light meal and progress to your normal diet. Heavy or fried foods are harder to digest and may make you feel sick to your stomach (nauseated).   Avoid alcoholic beverages for  24 hours or as instructed.    MEDICATIONS  You may resume your normal medications.   WHAT YOU CAN EXPECT TODAY  Some feelings of bloating in the abdomen.   Passage of more gas than usual.    IF YOU HAD A BIOPSY TAKEN DURING THE UPPER ENDOSCOPY:  Eat a soft diet IF YOU HAVE NAUSEA, BLOATING, ABDOMINAL PAIN, OR VOMITING.    FINDING OUT THE RESULTS OF YOUR TEST Not all test results are available during your visit. DR. Oneida Alar WILL CALL YOU WITHIN 14 DAYS OF YOUR PROCEDUE WITH YOUR RESULTS. Do not assume everything is normal if you have not heard from DR. Natsuko Kelsay, CALL HER OFFICE AT [phone].  SEEK IMMEDIATE MEDICAL ATTENTION AND CALL THE OFFICE: [phone] IF:  You have more than a spotting of blood in your stool.   Your belly is swollen (abdominal distention).   You are nauseated or vomiting.   You have a temperature over 101F.   You have abdominal pain or discomfort that is severe or gets worse throughout the day.  Gastritis  Gastritis is an inflammation (the body's way of reacting to injury and/or infection) of the stomach. It is often caused by viral or bacterial (germ) infections. It can also be caused BY ASPIRIN, BC/GOODY POWDER'S, (IBUPROFEN) MOTRIN, OR ALEVE (NAPROXEN), chemicals (including alcohol), SPICY FOODS, and medications. This illness may be associated with generalized malaise (feeling tired, not well), UPPER ABDOMINAL STOMACH cramps, and fever. One common bacterial  cause of gastritis is an organism known as H. Pylori. This can be treated with antibiotics.   ESOPHAGEAL STRICTURE  Esophageal strictures can be caused by stomach acid backing up into the tube that carries food from the mouth down to the stomach (lower esophagus).  TREATMENT There are a number of medicines used to treat reflux/stricture, including: Antacids.  Proton-pump inhibitors:PROTONIX PEPCID OR ZANTAC     Lifestyle and home remedies TO CONTROL HEARTBURN/REFLUX  You may eliminate  or reduce the frequency of heartburn by making the following lifestyle changes:   Control your weight. Being overweight is a major risk factor for heartburn and GERD. Excess pounds put pressure on your abdomen, pushing up your stomach and causing acid to back up into your esophagus.    Eat smaller meals. 4 TO 6 MEALS A DAY. This reduces pressure on the lower esophageal sphincter, helping to prevent the valve from opening and acid from washing back into your esophagus.    Loosen your belt. Clothes that fit tightly around your waist put pressure on your abdomen and the lower esophageal sphincter.    Eliminate heartburn triggers. Everyone has specific triggers. Common triggers such as fatty or fried foods, spicy food, tomato sauce, carbonated beverages, alcohol, chocolate, mint, garlic, onion, caffeine and nicotine may make heartburn worse.    Avoid stooping or bending. Tying your shoes is OK. Bending over for longer periods to weed your garden isn't, especially soon after eating.    Don't lie down after a meal. Wait at least three to four hours after eating before going to bed, and don't lie down right after eating.    Alternative medicine  Several home remedies exist for treating GERD, but they provide only temporary relief. They include drinking baking soda (sodium bicarbonate) added to water or drinking other fluids such as baking soda mixed with cream of tartar and water.   Although these liquids create temporary relief by neutralizing, washing away or buffering acids, eventually they aggravate the situation by adding gas and fluid to your stomach, increasing pressure and causing more acid reflux. Further, adding more sodium to your diet may increase your blood pressure and add stress to your heart, and excessive bicarbonate ingestion can alter the acid-base balance in your body.

## 2018-03-09 NOTE — H&P (Signed)
Primary Care Physician:  [name], MD Primary Gastroenterologist:  Dr. Oneida Alar  Pre-Procedure History & Physical: HPI:  Carl [name] is a 62 y.o. male here for dysphagia.  Past Medical History:  Diagnosis Date  . Carpal tunnel syndrome    left  . Diabetes mellitus    diet controlled  . Factor 5 Leiden mutation, heterozygous ([place])   . Hyperlipidemia   . Hypertension   . OSA (obstructive sleep apnea)    wears CPAP nightly    Past Surgical History:  Procedure Laterality Date  . ANKLE SURGERY  1996   Left  . CARPAL TUNNEL RELEASE Left 04/08/2015   Procedure: LEFT CARPAL TUNNEL RELEASE;  Surgeon: Daryll Brod, MD;  Location: [place];  Service: Orthopedics;  Laterality: Left;  . CARPAL TUNNEL RELEASE Right 05/06/2015   Procedure: RIGHT CARPAL TUNNEL RELEASE;  Surgeon: Daryll Brod, MD;  Location: [place];  Service: Orthopedics;  Laterality: Right;  ANESTHESIA:  IV REGIONAL FAB  . COLONOSCOPY  02/2010   Dr. Oneida Alar: Extremely redundant and tortuous sigmoid and transverse colon.  Normal ileocecal valve, cecum could not be intubated.  Noted to have diverticulosis.  Advised to have follow-up screening colonoscopy in 5 years at [facility] overtube and fluoroscopy.  Marland Kitchen HERNIA REPAIR    . INSERTION OF MESH N/A 04/10/2014   Procedure: INSERTION OF MESH;  Surgeon: Jamesetta So, MD;  Location: [facility];  Service: General;  Laterality: N/A;  . UMBILICAL HERNIA REPAIR N/A 04/10/2014   Procedure: HERNIA REPAIR UMBILICAL ADULT;  Surgeon: Jamesetta So, MD;  Location: [facility];  Service: General;  Laterality: N/A;    Prior to Admission medications   Medication Sig Start Date End Date Taking? Authorizing Provider  amLODipine-benazepril (LOTREL) 10-40 MG capsule Take 1 capsule by mouth daily.  12/22/17  Yes [provider]  aspirin 325 MG tablet Take 325 mg by mouth daily.   Yes [provider]  Cholecalciferol  (VITAMIN D3 PO) Take 50 mcg by mouth daily.   Yes [provider]  ibuprofen (ADVIL,MOTRIN) 200 MG tablet Take 400 mg by mouth every 6 (six) hours as needed for moderate pain.   Yes [provider]  oxymetazoline (AFRIN) 0.05 % nasal spray Place 2 sprays into both nostrils at bedtime.    Yes [provider]    Allergies as of 02/06/2018 - Review Complete 02/06/2018  Allergen Reaction Noted  . Amoxicillin  04/04/2014    Family History  Problem Relation Age of Onset  . Heart disease Mother   . Rheum arthritis Mother   . Clotting disorder Father   . Rheum arthritis Sister   . Colon cancer Neg Hx     Social History   Socioeconomic History  . Marital status: Married    Spouse name: Nevin Bloodgood  . Number of children: Y  . Years of education: Not on file  . Highest education level: Not on file  Occupational History  . Occupation: med Editor, commissioning  Social Needs  . Financial resource strain: Not on file  . Food insecurity:    Worry: Not on file    Inability: Not on file  . Transportation needs:    Medical: Not on file    Non-medical: Not on file  Tobacco Use  . Smoking status: Never Smoker  . Smokeless tobacco: Never Used  Substance and Sexual Activity  . Alcohol use: Yes    Comment: occ  . Drug use:  No  . Sexual activity: Yes  Lifestyle  . Physical activity:    Days per week: Not on file    Minutes per session: Not on file  . Stress: Not on file  Relationships  . Social connections:    Talks on phone: Not on file    Gets together: Not on file    Attends religious service: Not on file    Active member of club or organization: Not on file    Attends meetings of clubs or organizations: Not on file    Relationship status: Not on file  . Intimate partner violence:    Fear of current or ex partner: Not on file    Emotionally abused: Not on file    Physically abused: Not on file    Forced sexual activity: Not on file  Other Topics Concern  . Not  on file  Social History Narrative  . Not on file    Review of Systems: See HPI, otherwise negative ROS   Physical Exam: BP 115/83   Pulse (!) 58   Temp 98.2 F (36.8 C) (Oral)   Resp 15   Ht 5\' 9"  (1.753 m)   Wt 225 lb (102.1 kg)   SpO2 94%   BMI 33.23 kg/m  General:   Alert,  pleasant and cooperative in NAD Head:  Normocephalic and atraumatic. Neck:  Supple; Lungs:  Clear throughout to auscultation.    Heart:  Regular rate and rhythm. Abdomen:  Soft, nontender and nondistended. Normal bowel sounds, without guarding, and without rebound.   Neurologic:  Alert and  oriented x4;  grossly normal neurologically.  Impression/Plan:    DYSPHAGIA PLAN:  EGD/DIL TODAY. DISCUSSED PROCEDURE, BENEFITS, & RISKS: < 1% chance of medication reaction, bleeding, OR perforation.

## 2018-03-09 NOTE — Op Note (Signed)
[facility] Inpatient Patient Name: Carl [name] Procedure Date: 03/09/2018 10:15 AM MRN: [id number] Date of Birth: [birth year] Attending MD: Barney Drain MD, MD CSN: [id number] Age: 62 Admit Type: Outpatient Procedure:                Upper GI endoscopy WITH COLD FORCEPS                            BIOPSY/ESOPHAGEAL DILATION Indications:              Dysphagia Providers:                Barney Drain MD, MD, Janeece Riggers, RN, Nelma Rothman,                            Technician Referring MD:             [name] Medicines:                Meperidine 100 mg IV, Midazolam 5 mg IV Complications:            No immediate complications. Estimated Blood Loss:     Estimated blood loss was minimal. Procedure:                Pre-Anesthesia Assessment:                           - Prior to the procedure, a History and Physical                            was performed, and patient medications and                            allergies were reviewed. The patient's tolerance of                            previous anesthesia was also reviewed. The risks                            and benefits of the procedure and the sedation                            options and risks were discussed with the patient.                            All questions were answered, and informed consent                            was obtained. Prior Anticoagulants: The patient                            last took aspirin 2 days and ibuprofen 7 days prior                            to the procedure. ASA Grade Assessment: II - A  patient with mild systemic disease. After reviewing                            the risks and benefits, the patient was deemed in                            satisfactory condition to undergo the procedure.                            After obtaining informed consent, the endoscope was                            passed under direct vision. Throughout the                            procedure,  the patient's blood pressure, pulse, and                            oxygen saturations were monitored continuously. The                            [id number] ([id number]) scope was introduced through the                            mouth, and advanced to the second part of duodenum.                            The upper GI endoscopy was accomplished without                            difficulty. The patient tolerated the procedure                            well. Scope In: 10:57:08 AM Scope Out: 11:09:50 AM Total Procedure Duration: 0 hours 12 minutes 42 seconds  Findings:      One benign-appearing, intrinsic moderate (circumferential scarring or       stenosis; an endoscope may pass) stenosis was found. This stenosis       measured 1.3 cm (inner diameter). The stenosis was traversed. A       guidewire was placed and the scope was withdrawn. Dilation was performed       with a Savary dilator with mild resistance at 14 mm and 15 mm and       moderate resistance at 16 mm and 17 mm. Estimated blood loss: none.      A small hiatal hernia was present.      Diffuse mild inflammation characterized by congestion (edema) and       erythema was found in the gastric body and in the gastric antrum.       Biopsies were taken with a cold forceps for Helicobacter pylori       testing(2: BODY, 3: ANTRUM).      The examined duodenum was normal. 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                           - MILD Gastritis. Moderate Sedation:      Moderate (conscious) sedation was administered by the endoscopy nurse       and supervised by the endoscopist. The following parameters were       monitored: oxygen saturation, heart rate, blood pressure, and response       to care. Total physician intraservice time was 28 minutes. Recommendation:           - Await pathology results.                            - Low fat diet.                           - Continue present medications. START PROTONIX 30                            MINS PRIOR TO FIRST MEAL. CONTINUE INDEFINITELY.                           - Return to my office in 4 months.                           - Patient has a contact number available for                            emergencies. The signs and symptoms of potential                            delayed complications were discussed with the                            patient. Return to normal activities tomorrow.                            Written discharge instructions were provided to the                            patient. Procedure Code(s):        --- Professional ---                           [phone], Esophagogastroduodenoscopy, flexible,                            transoral; with insertion of guide wire followed by                            passage of dilator(s) through esophagus over guide  wire                           [id number], Esophagogastroduodenoscopy, flexible,                            transoral; with biopsy, single or multiple                           G0500, Moderate sedation services provided by the                            same physician or other qualified health care                            professional performing a gastrointestinal                            endoscopic service that sedation supports,                            requiring the presence of an independent trained                            observer to assist in the monitoring of the                            patient's level of consciousness and physiological                            status; initial 15 minutes of intra-service time;                            patient age 65 years or older (additional time may                            be reported with [phone], as appropriate)                           [phone], Moderate sedation services provided by the                             same physician or other qualified health care                            professional performing the diagnostic or                            therapeutic service that the sedation supports,                            requiring the presence of an independent trained                            observer to assist in the monitoring of the  patient's level of consciousness and physiological                            status; each additional 15 minutes intraservice                            time (List separately in addition to code for                            primary service) Diagnosis Code(s):        --- Professional ---                           K22.2, Esophageal obstruction                           K44.9, Diaphragmatic hernia without obstruction or                            gangrene                           K29.70, Gastritis, unspecified, without bleeding                           R13.10, Dysphagia, unspecified CPT copyright 2017 American Medical Association. All rights reserved. The codes documented in this report are preliminary and upon coder review may  be revised to meet current compliance requirements. Barney Drain, MD Barney Drain MD, MD 03/09/2018 11:20:02 AM This report has been signed electronically. Number of Addenda: 0

## 2018-03-13 NOTE — Progress Notes (Signed)
CC'D TO PCP °

## 2018-03-13 NOTE — Progress Notes (Signed)
PATIENT SCHEDULED  °

## 2018-03-14 NOTE — Progress Notes (Signed)
Pt is aware.  

## 2018-03-14 NOTE — Progress Notes (Signed)
LMOM to call.

## 2018-03-16 ENCOUNTER — Encounter (HOSPITAL_COMMUNITY): Payer: Self-pay | Admitting: Gastroenterology

## 2018-03-16 ENCOUNTER — Encounter: Payer: Self-pay | Admitting: Gastroenterology

## 2018-06-13 ENCOUNTER — Other Ambulatory Visit (HOSPITAL_COMMUNITY)
Admission: RE | Admit: 2018-06-13 | Discharge: 2018-06-13 | Disposition: A | Discharge status: Home / Self Care | Payer: BLUE CROSS/BLUE SHIELD | Source: Ambulatory Visit | Attending: Internal Medicine | Admitting: Internal Medicine

## 2018-06-13 DIAGNOSIS — E119 Type 2 diabetes mellitus without complications: Secondary | ICD-10-CM | POA: Insufficient documentation

## 2018-06-13 DIAGNOSIS — R1310 Dysphagia, unspecified: Secondary | ICD-10-CM | POA: Diagnosis not present

## 2018-06-13 DIAGNOSIS — R69 Illness, unspecified: Secondary | ICD-10-CM | POA: Diagnosis present

## 2018-06-13 DIAGNOSIS — I10 Essential (primary) hypertension: Secondary | ICD-10-CM | POA: Insufficient documentation

## 2018-06-13 LAB — LIPID PANEL
CHOLESTEROL: 163 mg/dL (ref 0–200)
HDL: 37 mg/dL — ABNORMAL LOW (ref >40)
LDL Cholesterol: 109 mg/dL — ABNORMAL HIGH (ref 0–99)
TRIGLYCERIDES: 86 mg/dL (ref <150)
Total CHOL/HDL Ratio: 4.4 RATIO
VLDL: 17 mg/dL (ref 0–40)

## 2018-06-13 LAB — CBC
HEMATOCRIT: 41.4 % (ref 39.0–52.0)
HEMOGLOBIN: 14 g/dL (ref 13.0–17.0)
MCH: 28.6 pg (ref 26.0–34.0)
MCHC: 33.8 g/dL (ref 30.0–36.0)
MCV: 84.7 fL (ref 78.0–100.0)
Platelets: 227 10*3/uL (ref 150–400)
RBC: 4.89 MIL/uL (ref 4.22–5.81)
RDW: 13.1 % (ref 11.5–15.5)
WBC: 5.8 10*3/uL (ref 4.0–10.5)

## 2018-06-13 LAB — COMPREHENSIVE METABOLIC PANEL
ALT: 28 U/L (ref 0–44)
AST: 19 U/L (ref 15–41)
Albumin: 3.9 g/dL (ref 3.5–5.0)
Alkaline Phosphatase: 63 U/L (ref 38–126)
Anion gap: 6 (ref 5–15)
BILIRUBIN TOTAL: 0.7 mg/dL (ref 0.3–1.2)
BUN: 10 mg/dL (ref 8–23)
CO2: 26 mmol/L (ref 22–32)
Calcium: 8.8 mg/dL — ABNORMAL LOW (ref 8.9–10.3)
Chloride: 107 mmol/L (ref 98–111)
Creatinine, Ser: 0.69 mg/dL (ref 0.61–1.24)
Glucose, Bld: 103 mg/dL — ABNORMAL HIGH (ref 70–99)
Potassium: 3.9 mmol/L (ref 3.5–5.1)
SODIUM: 139 mmol/L (ref 135–145)
TOTAL PROTEIN: 6.6 g/dL (ref 6.5–8.1)

## 2018-06-13 LAB — PSA: Prostatic Specific Antigen: 2.01 ng/mL (ref 0.00–4.00)

## 2018-06-14 LAB — HEMOGLOBIN A1C
Hgb A1c MFr Bld: 5.8 % — ABNORMAL HIGH (ref 4.8–5.6)
Mean Plasma Glucose: 120 mg/dL

## 2018-07-12 ENCOUNTER — Encounter: Payer: Self-pay | Admitting: Gastroenterology

## 2018-07-12 ENCOUNTER — Ambulatory Visit: Payer: BLUE CROSS/BLUE SHIELD | Admitting: Gastroenterology

## 2018-07-12 DIAGNOSIS — K2960 Other gastritis without bleeding: Secondary | ICD-10-CM

## 2018-07-12 DIAGNOSIS — R1310 Dysphagia, unspecified: Secondary | ICD-10-CM | POA: Diagnosis not present

## 2018-07-12 DIAGNOSIS — Z1211 Encounter for screening for malignant neoplasm of colon: Secondary | ICD-10-CM | POA: Diagnosis not present

## 2018-07-12 DIAGNOSIS — T39395A Adverse effect of other nonsteroidal anti-inflammatory drugs [NSAID], initial encounter: Secondary | ICD-10-CM

## 2018-07-12 DIAGNOSIS — R1319 Other dysphagia: Secondary | ICD-10-CM

## 2018-07-12 MED ORDER — PANTOPRAZOLE SODIUM 40 MG PO TBEC: DELAYED_RELEASE_TABLET | ORAL | 3 refills | Status: DC

## 2018-07-12 NOTE — Patient Instructions (Addendum)
DRINK WATER TO KEEP YOUR URINE LIGHT YELLOW.  CONTINUE YOUR WEIGHT LOSS EFFORTS. YOUR BODY MASS INDEX IS OVER 30 WHICH MEANS YOU ARE OBESE. OBESITY IS ASSOCIATED WITH AN INCREASED FOR CIRRHOSIS AND ALL CANCERS, INCLUDING ESOPHAGEAL AND COLON CANCER. A WEIGHT OF 200 LBS OR LESS  WILL GET YOUR BODY MASS INDEX(BMI) UNDER 30.  AVOID REFLUX TRIGGERS. SEE INFO BELOW.  CONTINUE PROTONIX. TAKE 30 MINUTES PRIOR TO BREAKFAST.  FOLLOW UP IN 1 YEAR.   Lifestyle and home remedies TO MANAGE REFLUX  You may eliminate or reduce the frequency of heartburn by making the following lifestyle changes:  . Control your weight. Being overweight is a major risk factor for heartburn and GERD. Excess pounds put pressure on your abdomen, pushing up your stomach and causing acid to back up into your esophagus.   . Eat smaller meals. 4 TO 6 MEALS A DAY. This reduces pressure on the lower esophageal sphincter, helping to prevent the valve from opening and acid from washing back into your esophagus.   Dolphus Jenny your belt. Clothes that fit tightly around your waist put pressure on your abdomen and the lower esophageal sphincter.   . Eliminate heartburn triggers. Everyone has specific triggers. Common triggers such as fatty or fried foods, spicy food, tomato sauce, carbonated beverages, alcohol, chocolate, mint, garlic, onion, caffeine and nicotine may make heartburn worse.   Marland Kitchen Avoid stooping or bending. Tying your shoes is OK. Bending over for longer periods to weed your garden isn't, especially soon after eating.   . Don't lie down after a meal. Wait at least three to four hours after eating before going to bed, and don't lie down right after eating.   Marland Kitchen PUT THE HEAD OF YOUR BED ON 6 INCH BLOCKS.   Alternative medicine . Several home remedies exist for treating GERD, but they provide only temporary relief. They include drinking baking soda (sodium bicarbonate) added to water or drinking other fluids such as baking  soda mixed with cream of tartar and water.  . Although these liquids create temporary relief by neutralizing, washing away or buffering acids, eventually they aggravate the situation by adding gas and fluid to your stomach, increasing pressure and causing more acid reflux. Further, adding more sodium to your diet may increase your blood pressure and add stress to your heart, and excessive bicarbonate ingestion can alter the acid-base balance in your body.      Low-Fat Diet BREADS, CEREALS, PASTA, RICE, DRIED PEAS, AND BEANS These products are high in carbohydrates and most are low in fat. Therefore, they can be increased in the diet as substitutes for fatty foods. They too, however, contain calories and should not be eaten in excess. Cereals can be eaten for snacks as well as for breakfast.   FRUITS AND VEGETABLES It is good to eat fruits and vegetables. Besides being sources of fiber, both are rich in vitamins and some minerals. They help you get the daily allowances of these nutrients. Fruits and vegetables can be used for snacks and desserts.  MEATS Limit lean meat, chicken, [place], and fish to no more than 6 ounces per day. Beef, Pork, and Lamb Use lean cuts of beef, pork, and lamb. Lean cuts include:  Extra-lean ground beef.  Arm roast.  Sirloin tip.  Center-cut ham.  Round steak.  Loin chops.  Rump roast.  Tenderloin.  Trim all fat off the outside of meats before cooking. It is not necessary to severely decrease the intake of red meat,  but lean choices should be made. Lean meat is rich in protein and contains a highly absorbable form of iron. Premenopausal women, in particular, should avoid reducing lean red meat because this could increase the risk for low red blood cells (iron-deficiency anemia).  Chicken and [place] These are good sources of protein. The fat of poultry can be reduced by removing the skin and underlying fat layers before cooking. Chicken and [place] can be  substituted for lean red meat in the diet. Poultry should not be fried or covered with high-fat sauces. Fish and Shellfish Fish is a good source of protein. Shellfish contain cholesterol, but they usually are low in saturated fatty acids. The preparation of fish is important. Like chicken and [place], they should not be fried or covered with high-fat sauces. EGGS Egg whites contain no fat or cholesterol. They can be eaten often. Try 1 to 2 egg whites instead of whole eggs in recipes or use egg substitutes that do not contain yolk. MILK AND DAIRY PRODUCTS Use skim or 1% milk instead of 2% or whole milk. Decrease whole milk, natural, and processed cheeses. Use nonfat or low-fat (2%) cottage cheese or low-fat cheeses made from vegetable oils. Choose nonfat or low-fat (1 to 2%) yogurt. Experiment with evaporated skim milk in recipes that call for heavy cream. Substitute low-fat yogurt or low-fat cottage cheese for sour cream in dips and salad dressings. Have at least 2 servings of low-fat dairy products, such as 2 glasses of skim (or 1%) milk each day to help get your daily calcium intake. FATS AND OILS Reduce the total intake of fats, especially saturated fat. Butterfat, lard, and beef fats are high in saturated fat and cholesterol. These should be avoided as much as possible. Vegetable fats do not contain cholesterol, but certain vegetable fats, such as coconut oil, palm oil, and palm kernel oil are very high in saturated fats. These should be limited. These fats are often used in bakery goods, processed foods, popcorn, oils, and nondairy creamers. Vegetable shortenings and some peanut butters contain hydrogenated oils, which are also saturated fats. Read the labels on these foods and check for saturated vegetable oils. Unsaturated vegetable oils and fats do not raise blood cholesterol. However, they should be limited because they are fats and are high in calories. Total fat should still be limited to 30% of  your daily caloric intake. Desirable liquid vegetable oils are corn oil, cottonseed oil, olive oil, canola oil, safflower oil, soybean oil, and sunflower oil. Peanut oil is not as good, but small amounts are acceptable. Buy a heart-healthy tub margarine that has no partially hydrogenated oils in the ingredients. Mayonnaise and salad dressings often are made from unsaturated fats, but they should also be limited because of their high calorie and fat content. Seeds, nuts, peanut butter, olives, and avocados are high in fat, but the fat is mainly the unsaturated type. These foods should be limited mainly to avoid excess calories and fat. OTHER EATING TIPS Snacks  Most sweets should be limited as snacks. They tend to be rich in calories and fats, and their caloric content outweighs their nutritional value. Some good choices in snacks are graham crackers, melba toast, soda crackers, bagels (no egg), English muffins, fruits, and vegetables. These snacks are preferable to snack crackers, [place] fries, TORTILLA CHIPS, and POTATO chips. Popcorn should be air-popped or cooked in small amounts of liquid vegetable oil. Desserts Eat fruit, low-fat yogurt, and fruit ices instead of pastries, cake, and cookies. Sherbet,  angel food cake, gelatin dessert, frozen low-fat yogurt, or other frozen products that do not contain saturated fat (pure fruit juice bars, frozen ice pops) are also acceptable.  COOKING METHODS Choose those methods that use little or no fat. They include: Poaching.  Braising.  Steaming.  Grilling.  Baking.  Stir-frying.  Broiling.  Microwaving.  Foods can be cooked in a nonstick pan without added fat, or use a nonfat cooking spray in regular cookware. Limit fried foods and avoid frying in saturated fat. Add moisture to lean meats by using water, broth, cooking wines, and other nonfat or low-fat sauces along with the cooking methods mentioned above. Soups and stews should be chilled after cooking.  The fat that forms on top after a few hours in the refrigerator should be skimmed off. When preparing meals, avoid using excess salt. Salt can contribute to raising blood pressure in some people.  EATING AWAY FROM HOME Order entres, potatoes, and vegetables without sauces or butter. When meat exceeds the size of a deck of cards (3 to 4 ounces), the rest can be taken home for another meal. Choose vegetable or fruit salads and ask for low-calorie salad dressings to be served on the side. Use dressings sparingly. Limit high-fat toppings, such as bacon, crumbled eggs, cheese, sunflower seeds, and olives. Ask for heart-healthy tub margarine instead of butter.

## 2018-07-12 NOTE — Assessment & Plan Note (Addendum)
REQUIRES ASA FOR Fc5 LEIDEN  DRINK WATER TO KEEP YOUR URINE LIGHT YELLOW. CONTINUE PROTONIX. TAKE 30 MINUTES PRIOR TO BREAKFAST.  FOLLOW UP IN 1 YEAR.

## 2018-07-12 NOTE — Progress Notes (Signed)
Subjective:    Patient ID: Carl [name], male    DOB: 06/25/1956, 62 y.o.   MRN: [id number]  [name], MD   HPI SWALLOWING IS GOOD. BMs: FINE. FLU SHOT YES. NO PROBLEMS AFTER ENDOSCOPY.  PT DENIES FEVER, CHILLS, HEMATOCHEZIA, HEMATEMESIS, nausea, vomiting, melena, diarrhea, CHEST PAIN, SHORTNESS OF BREATH,  CHANGE IN BOWEL IN HABITS, constipation, abdominal pain, problems swallowing, problems with sedation, OR heartburn or indigestion.  Past Medical History:  Diagnosis Date  . Carpal tunnel syndrome    left  . Diabetes mellitus    diet controlled  . Factor 5 Leiden mutation, heterozygous ([place])   . Hyperlipidemia   . Hypertension   . OSA (obstructive sleep apnea)    wears CPAP nightly   Past Surgical History:  Procedure Laterality Date  . ANKLE SURGERY  1996   Left  . CARPAL TUNNEL RELEASE Left 04/08/2015   Procedure: LEFT CARPAL TUNNEL RELEASE;  Surgeon: Daryll Brod, MD;  Location: [facility];  Service: Orthopedics;  Laterality: Left;  . CARPAL TUNNEL RELEASE Right 05/06/2015   Procedure: RIGHT CARPAL TUNNEL RELEASE;  Surgeon: Daryll Brod, MD;  Location: [facility];  Service: Orthopedics;  Laterality: Right;  ANESTHESIA:  IV REGIONAL FAB  . COLONOSCOPY  02/2010   Dr. Oneida Alar: Extremely redundant and tortuous sigmoid and transverse colon.  Normal ileocecal valve, cecum could not be intubated.  Noted to have diverticulosis.  Advised to have follow-up screening colonoscopy in 5 years at [facility] overtube and fluoroscopy.  . ESOPHAGOGASTRODUODENOSCOPY N/A 03/09/2018   Procedure: ESOPHAGOGASTRODUODENOSCOPY (EGD);  Surgeon: Danie Binder, MD;  [facility];  Service: Endoscopy;  Laterality: N/A;  8:30am  . HERNIA REPAIR    . INSERTION OF MESH N/A 04/10/2014   Procedure: INSERTION OF MESH;  Surgeon: Jamesetta So, MD;  Location: [facility];  Service: General;  Laterality: N/A;  . SAVORY DILATION N/A  03/09/2018   Procedure: SAVORY DILATION;  Surgeon: Danie Binder, MD;  Location: [facility];  Service: Endoscopy;  Laterality: N/A;  . UMBILICAL HERNIA REPAIR N/A 04/10/2014   Procedure: HERNIA REPAIR UMBILICAL ADULT;  Surgeon: Jamesetta So, MD;  Location: [facility];  Service: General;  Laterality: N/A;    Allergies  Allergen Reactions  . Amoxicillin Other (See Comments)        Current Outpatient Medications  Medication Sig    . amLODipine-benazepril (LOTREL) 10-40 MG capsule Take 1 capsule by mouth daily.     Marland Kitchen aspirin 325 MG tablet Take 325 mg by mouth daily.    . Cholecalciferol (VITAMIN D3 PO) Take 50 mcg by mouth daily.    Marland Kitchen ibuprofen (ADVIL,MOTRIN) 200 MG tablet Take 400 mg by mouth every 6 (six) hours as needed for moderate pain.    Marland Kitchen oxymetazoline (AFRIN) 0.05 % nasal spray Place 2 sprays into both nostrils at bedtime.     . pantoprazole (PROTONIX) 40 MG tablet 1 po 30 mins prior to first meal     Review of Systems PER HPI OTHERWISE ALL SYSTEMS ARE NEGATIVE.    Objective:   Physical Exam  Constitutional: He is oriented to person, place, and time. He appears well-developed and well-nourished. No distress.  HENT:  Head: Normocephalic and atraumatic.  Mouth/Throat: Oropharynx is clear and moist. No oropharyngeal exudate.  Eyes: Pupils are equal, round, and reactive to light. No scleral icterus.  Neck: Normal range of motion. Neck supple.  Cardiovascular: Normal rate, regular  rhythm and normal heart sounds.  Pulmonary/Chest: Effort normal and breath sounds normal. No respiratory distress.  Abdominal: Soft. Bowel sounds are normal. He exhibits no distension. There is no tenderness.  Musculoskeletal: He exhibits no edema.  Lymphadenopathy:    He has no cervical adenopathy.  Neurological: He is alert and oriented to person, place, and time.  NO  NEW FOCAL DEFICITS  Psychiatric: He has a normal mood and affect.  Vitals reviewed.     Assessment & Plan:

## 2018-07-12 NOTE — Progress Notes (Signed)
ON RECALL  °

## 2018-07-12 NOTE — Assessment & Plan Note (Addendum)
SYMPTOMS CONTROLLED/RESOLVED.  DRINK WATER TO KEEP YOUR URINE LIGHT YELLOW. CONTINUE YOUR WEIGHT LOSS EFFORTS. YOUR BODY MASS INDEX IS OVER 30 WHICH MEANS YOU ARE OBESE. OBESITY IS ASSOCIATED WITH AN INCREASED FOR CIRRHOSIS AND ALL CANCERS, INCLUDING ESOPHAGEAL AND COLON CANCER. A WEIGHT OF 200 LBS OR LESS  WILL GET YOUR BODY MASS INDEX(BMI) UNDER 30. AVOID REFLUX TRIGGERS.  HANDOUT GIVEN. REVIEWED EGD PICS/REPORT. CONTINUE PROTONIX. TAKE 30 MINUTES PRIOR TO BREAKFAST. [place] OF CONTINUING PROTONIX. FOLLOW UP IN 1 YEAR.  CALL WITH QUESTIONS OR CONCERNS.

## 2018-07-12 NOTE — Assessment & Plan Note (Addendum)
AVERAGE RISK. NO BRBPR OR MELENA.  REVIEWED TCS 2011. NEXT COLONOSCOPY IN 2021.

## 2018-07-12 NOTE — Progress Notes (Signed)
CC'D TO PCP °

## 2018-09-27 DIAGNOSIS — Z86718 Personal history of other venous thrombosis and embolism: Secondary | ICD-10-CM

## 2018-09-27 HISTORY — DX: Personal history of other venous thrombosis and embolism: Z86.718

## 2018-12-04 ENCOUNTER — Ambulatory Visit: Payer: BLUE CROSS/BLUE SHIELD | Admitting: Pulmonary Disease

## 2018-12-05 ENCOUNTER — Other Ambulatory Visit (HOSPITAL_COMMUNITY)
Admission: RE | Admit: 2018-12-05 | Discharge: 2018-12-05 | Disposition: A | Discharge status: Home / Self Care | Payer: BLUE CROSS/BLUE SHIELD | Source: Ambulatory Visit | Attending: Internal Medicine | Admitting: Internal Medicine

## 2018-12-05 DIAGNOSIS — E119 Type 2 diabetes mellitus without complications: Secondary | ICD-10-CM | POA: Insufficient documentation

## 2018-12-05 DIAGNOSIS — E785 Hyperlipidemia, unspecified: Secondary | ICD-10-CM | POA: Insufficient documentation

## 2018-12-05 DIAGNOSIS — I10 Essential (primary) hypertension: Secondary | ICD-10-CM | POA: Insufficient documentation

## 2018-12-05 DIAGNOSIS — N529 Male erectile dysfunction, unspecified: Secondary | ICD-10-CM | POA: Insufficient documentation

## 2018-12-05 LAB — URIC ACID: URIC ACID, SERUM: 7.3 mg/dL (ref 3.7–8.6)

## 2018-12-05 LAB — HEMOGLOBIN A1C
HEMOGLOBIN A1C: 5.6 % (ref 4.8–5.6)
Mean Plasma Glucose: 114.02 mg/dL

## 2018-12-06 ENCOUNTER — Encounter: Payer: Self-pay | Admitting: Pulmonary Disease

## 2018-12-06 ENCOUNTER — Other Ambulatory Visit: Payer: Self-pay

## 2018-12-06 ENCOUNTER — Ambulatory Visit: Payer: BLUE CROSS/BLUE SHIELD | Admitting: Pulmonary Disease

## 2018-12-06 DIAGNOSIS — Z9989 Dependence on other enabling machines and devices: Secondary | ICD-10-CM | POA: Diagnosis not present

## 2018-12-06 DIAGNOSIS — G4733 Obstructive sleep apnea (adult) (pediatric): Secondary | ICD-10-CM | POA: Diagnosis not present

## 2018-12-06 DIAGNOSIS — I10 Essential (primary) hypertension: Secondary | ICD-10-CM

## 2018-12-06 LAB — MICROALBUMIN / CREATININE URINE RATIO
CREATININE, UR: 133.9 mg/dL
Microalb Creat Ratio: 5 mg/g creat (ref 0–29)
Microalb, Ur: 6.3 ug/mL — ABNORMAL HIGH

## 2018-12-06 NOTE — Patient Instructions (Signed)
CPAP is working well at 10 cm CPAP supplies will be renewed for a year.  Cough may be related to lisinopril and this can be changed to ARB

## 2018-12-06 NOTE — Assessment & Plan Note (Signed)
Cough may be related to lisinopril and this can be changed to ARB by PCP

## 2018-12-06 NOTE — Assessment & Plan Note (Signed)
CPAP is working well at 10 cm CPAP supplies will be renewed for a year. He is compliant and CPAP is certainly helped improve his daytime somnolence and fatigue  Weight loss encouraged, compliance with goal of at least 4-6 hrs every night is the expectation. Advised against medications with sedative side effects Cautioned against driving when sleepy - understanding that sleepiness will vary on a day to day basis

## 2018-12-06 NOTE — Progress Notes (Signed)
   Subjective:    Patient ID: Carl [name], male    DOB: 1956-03-18, 63 y.o.   MRN: [id number]  HPI  63 year old for follow-up of severe OSA, on CPAP since 1999 PMH - heterozygous factor V Leyden deficiency and takes an aspirin daily  Chief Complaint  Patient presents with  . Follow-up    64yr f/u for OSA. States his breathing has been ok since last visit. Uses [facility] as his DME.    He got a new machine in 2018, this is his third machine and this is worked well.  This is quieter.  He is comfortable on his nasal mask.  Denies sleep pressure in the afternoons. Weight has not changed. Download was reviewed which shows excellent compliance more than 7 hours every night, on 10 cm, no residual events, mild leak which seems to have developed over the past month  Blood pressure is well controlled on lisinopril combination.  He does report a dry throat clearing type cough  Significant tests/ events reviewed  PSG  2/99 - severe OSA with RDI 73/hr >> CPAP of 11 cm   Review of Systems Patient denies significant dyspnea,cough, hemoptysis,  chest pain, palpitations, pedal edema, orthopnea, paroxysmal nocturnal dyspnea, lightheadedness, nausea, vomiting, abdominal or  leg pains      Objective:   Physical Exam   Gen. Pleasant, obese, in no distress ENT - no lesions, no post nasal drip Neck: No JVD, no thyromegaly, no carotid bruits Lungs: no use of accessory muscles, no dullness to percussion, decreased without rales or rhonchi  Cardiovascular: Rhythm regular, heart sounds  normal, no murmurs or gallops, no peripheral edema Musculoskeletal: No deformities, no cyanosis or clubbing , no tremors        Assessment & Plan:

## 2019-05-15 ENCOUNTER — Ambulatory Visit (HOSPITAL_COMMUNITY)
Admission: RE | Admit: 2019-05-15 | Discharge: 2019-05-15 | Disposition: A | Discharge status: Home / Self Care | Payer: BC Managed Care – PPO | Source: Ambulatory Visit | Attending: Internal Medicine | Admitting: Internal Medicine

## 2019-05-15 ENCOUNTER — Other Ambulatory Visit: Payer: Self-pay | Admitting: Internal Medicine

## 2019-05-15 ENCOUNTER — Other Ambulatory Visit (HOSPITAL_COMMUNITY): Payer: Self-pay | Admitting: Internal Medicine

## 2019-05-15 ENCOUNTER — Other Ambulatory Visit: Payer: Self-pay

## 2019-05-15 DIAGNOSIS — M7989 Other specified soft tissue disorders: Secondary | ICD-10-CM | POA: Insufficient documentation

## 2019-05-30 ENCOUNTER — Other Ambulatory Visit (HOSPITAL_COMMUNITY)
Admission: RE | Admit: 2019-05-30 | Discharge: 2019-05-30 | Disposition: A | Discharge status: Home / Self Care | Payer: BC Managed Care – PPO | Source: Ambulatory Visit | Attending: Internal Medicine | Admitting: Internal Medicine

## 2019-05-30 DIAGNOSIS — T704XXA Effects of high-pressure fluids, initial encounter: Secondary | ICD-10-CM | POA: Insufficient documentation

## 2019-05-30 DIAGNOSIS — I82401 Acute embolism and thrombosis of unspecified deep veins of right lower extremity: Secondary | ICD-10-CM | POA: Insufficient documentation

## 2019-05-30 LAB — LIPID PANEL
Cholesterol: 181 mg/dL (ref 0–200)
HDL: 45 mg/dL (ref >40)
LDL Cholesterol: 120 mg/dL — ABNORMAL HIGH (ref 0–99)
Total CHOL/HDL Ratio: 4 RATIO
Triglycerides: 82 mg/dL (ref <150)
VLDL: 16 mg/dL (ref 0–40)

## 2019-05-30 LAB — HEMOGLOBIN A1C
Hgb A1c MFr Bld: 5.8 % — ABNORMAL HIGH (ref 4.8–5.6)
Mean Plasma Glucose: 119.76 mg/dL

## 2019-05-30 LAB — COMPREHENSIVE METABOLIC PANEL
ALT: 29 U/L (ref 0–44)
AST: 20 U/L (ref 15–41)
Albumin: 4.3 g/dL (ref 3.5–5.0)
Alkaline Phosphatase: 55 U/L (ref 38–126)
Anion gap: 6 (ref 5–15)
BUN: 11 mg/dL (ref 8–23)
CO2: 24 mmol/L (ref 22–32)
Calcium: 8.7 mg/dL — ABNORMAL LOW (ref 8.9–10.3)
Chloride: 107 mmol/L (ref 98–111)
Creatinine, Ser: 0.6 mg/dL — ABNORMAL LOW (ref 0.61–1.24)
GFR calc Af Amer: >60 mL/min (ref >60)
GFR calc non Af Amer: >60 mL/min (ref >60)
Glucose, Bld: 109 mg/dL — ABNORMAL HIGH (ref 70–99)
Potassium: 3.6 mmol/L (ref 3.5–5.1)
Sodium: 137 mmol/L (ref 135–145)
Total Bilirubin: 0.8 mg/dL (ref 0.3–1.2)
Total Protein: 7.2 g/dL (ref 6.5–8.1)

## 2019-05-30 LAB — CBC
HCT: 44.3 % (ref 39.0–52.0)
Hemoglobin: 14.6 g/dL (ref 13.0–17.0)
MCH: 28.4 pg (ref 26.0–34.0)
MCHC: 33 g/dL (ref 30.0–36.0)
MCV: 86.2 fL (ref 80.0–100.0)
Platelets: 279 10*3/uL (ref 150–400)
RBC: 5.14 MIL/uL (ref 4.22–5.81)
RDW: 12.6 % (ref 11.5–15.5)
WBC: 6.4 10*3/uL (ref 4.0–10.5)
nRBC: 0 % (ref 0.0–0.2)

## 2019-05-30 LAB — PSA: Prostatic Specific Antigen: 2.38 ng/mL (ref 0.00–4.00)

## 2019-06-26 ENCOUNTER — Encounter: Payer: Self-pay | Admitting: Gastroenterology

## 2019-07-17 ENCOUNTER — Other Ambulatory Visit: Payer: Self-pay

## 2019-07-17 DIAGNOSIS — Z20822 Contact with and (suspected) exposure to covid-19: Secondary | ICD-10-CM

## 2019-07-18 LAB — NOVEL CORONAVIRUS, NAA: SARS-CoV-2, NAA: DETECTED — AB

## 2019-08-31 ENCOUNTER — Other Ambulatory Visit (HOSPITAL_COMMUNITY): Payer: Self-pay | Admitting: Internal Medicine

## 2019-08-31 DIAGNOSIS — I82492 Acute embolism and thrombosis of other specified deep vein of left lower extremity: Secondary | ICD-10-CM

## 2019-09-03 ENCOUNTER — Ambulatory Visit (HOSPITAL_COMMUNITY): Payer: BC Managed Care – PPO

## 2019-09-04 ENCOUNTER — Other Ambulatory Visit: Payer: Self-pay

## 2019-09-04 ENCOUNTER — Ambulatory Visit (HOSPITAL_COMMUNITY)
Admission: RE | Admit: 2019-09-04 | Discharge: 2019-09-04 | Disposition: A | Discharge status: Home / Self Care | Payer: BC Managed Care – PPO | Source: Ambulatory Visit | Attending: Internal Medicine | Admitting: Internal Medicine

## 2019-09-04 DIAGNOSIS — I82492 Acute embolism and thrombosis of other specified deep vein of left lower extremity: Secondary | ICD-10-CM | POA: Diagnosis not present

## 2019-09-07 ENCOUNTER — Other Ambulatory Visit (HOSPITAL_COMMUNITY)
Admission: RE | Admit: 2019-09-07 | Discharge: 2019-09-07 | Disposition: A | Discharge status: Home / Self Care | Payer: BC Managed Care – PPO | Source: Ambulatory Visit | Attending: Internal Medicine | Admitting: Internal Medicine

## 2019-09-07 DIAGNOSIS — I82409 Acute embolism and thrombosis of unspecified deep veins of unspecified lower extremity: Secondary | ICD-10-CM | POA: Insufficient documentation

## 2019-09-07 LAB — PROTIME-INR
INR: 1.3 — ABNORMAL HIGH (ref 0.8–1.2)
Prothrombin Time: 16 seconds — ABNORMAL HIGH (ref 11.4–15.2)

## 2019-09-10 ENCOUNTER — Other Ambulatory Visit (HOSPITAL_COMMUNITY)
Admission: RE | Admit: 2019-09-10 | Discharge: 2019-09-10 | Disposition: A | Discharge status: Home / Self Care | Payer: BC Managed Care – PPO | Source: Ambulatory Visit | Attending: Internal Medicine | Admitting: Internal Medicine

## 2019-09-10 ENCOUNTER — Other Ambulatory Visit: Payer: Self-pay

## 2019-09-10 DIAGNOSIS — Z86718 Personal history of other venous thrombosis and embolism: Secondary | ICD-10-CM | POA: Diagnosis present

## 2019-09-10 LAB — PROTIME-INR
INR: 2.6 — ABNORMAL HIGH (ref 0.8–1.2)
Prothrombin Time: 27.9 seconds — ABNORMAL HIGH (ref 11.4–15.2)

## 2019-09-12 ENCOUNTER — Other Ambulatory Visit (HOSPITAL_COMMUNITY)
Admission: RE | Admit: 2019-09-12 | Discharge: 2019-09-12 | Disposition: A | Discharge status: Home / Self Care | Payer: BC Managed Care – PPO | Source: Ambulatory Visit | Attending: Internal Medicine | Admitting: Internal Medicine

## 2019-09-12 DIAGNOSIS — I82409 Acute embolism and thrombosis of unspecified deep veins of unspecified lower extremity: Secondary | ICD-10-CM | POA: Diagnosis not present

## 2019-09-13 LAB — PROTIME-INR
INR: 2.9 — ABNORMAL HIGH (ref 0.8–1.2)
Prothrombin Time: 30 seconds — ABNORMAL HIGH (ref 11.4–15.2)

## 2019-09-17 ENCOUNTER — Other Ambulatory Visit (HOSPITAL_COMMUNITY)
Admission: RE | Admit: 2019-09-17 | Discharge: 2019-09-17 | Disposition: A | Discharge status: Home / Self Care | Payer: BC Managed Care – PPO | Source: Ambulatory Visit | Attending: Internal Medicine | Admitting: Internal Medicine

## 2019-09-17 DIAGNOSIS — Z7901 Long term (current) use of anticoagulants: Secondary | ICD-10-CM | POA: Diagnosis not present

## 2019-09-17 LAB — PROTIME-INR
INR: 2.9 — ABNORMAL HIGH (ref 0.8–1.2)
Prothrombin Time: 30.1 seconds — ABNORMAL HIGH (ref 11.4–15.2)

## 2019-09-19 ENCOUNTER — Other Ambulatory Visit (HOSPITAL_COMMUNITY)
Admission: RE | Admit: 2019-09-19 | Discharge: 2019-09-19 | Disposition: A | Discharge status: Home / Self Care | Payer: BC Managed Care – PPO | Source: Ambulatory Visit | Attending: Internal Medicine | Admitting: Internal Medicine

## 2019-09-19 DIAGNOSIS — I82409 Acute embolism and thrombosis of unspecified deep veins of unspecified lower extremity: Secondary | ICD-10-CM | POA: Insufficient documentation

## 2019-09-19 LAB — PROTIME-INR
INR: 2.9 — ABNORMAL HIGH (ref 0.8–1.2)
Prothrombin Time: 30.6 seconds — ABNORMAL HIGH (ref 11.4–15.2)

## 2019-09-26 ENCOUNTER — Other Ambulatory Visit: Payer: Self-pay

## 2019-09-26 ENCOUNTER — Other Ambulatory Visit (HOSPITAL_COMMUNITY)
Admission: RE | Admit: 2019-09-26 | Discharge: 2019-09-26 | Disposition: A | Discharge status: Home / Self Care | Payer: BC Managed Care – PPO | Source: Ambulatory Visit | Attending: Internal Medicine | Admitting: Internal Medicine

## 2019-09-26 DIAGNOSIS — Z7901 Long term (current) use of anticoagulants: Secondary | ICD-10-CM | POA: Insufficient documentation

## 2019-09-26 LAB — PROTIME-INR
INR: 3 — ABNORMAL HIGH (ref 0.8–1.2)
Prothrombin Time: 30.9 seconds — ABNORMAL HIGH (ref 11.4–15.2)

## 2019-10-03 ENCOUNTER — Telehealth: Payer: Self-pay

## 2019-10-03 ENCOUNTER — Other Ambulatory Visit: Payer: Self-pay | Admitting: Gastroenterology

## 2019-10-03 ENCOUNTER — Other Ambulatory Visit (HOSPITAL_COMMUNITY)
Admission: RE | Admit: 2019-10-03 | Discharge: 2019-10-03 | Disposition: A | Discharge status: Home / Self Care | Payer: BC Managed Care – PPO | Source: Ambulatory Visit | Attending: Internal Medicine | Admitting: Internal Medicine

## 2019-10-03 DIAGNOSIS — Z7901 Long term (current) use of anticoagulants: Secondary | ICD-10-CM | POA: Diagnosis present

## 2019-10-03 LAB — PROTIME-INR
INR: 2.8 — ABNORMAL HIGH (ref 0.8–1.2)
Prothrombin Time: 29.6 seconds — ABNORMAL HIGH (ref 11.4–15.2)

## 2019-10-03 MED ORDER — PANTOPRAZOLE SODIUM 40 MG PO TBEC: DELAYED_RELEASE_TABLET | ORAL | 3 refills | Status: DC

## 2019-10-03 NOTE — Telephone Encounter (Signed)
Spoke with pt. Pt notified of results.  

## 2019-10-03 NOTE — Telephone Encounter (Signed)
Lmom of cell and called home phone and left message with wife for pt to call us back.

## 2019-10-03 NOTE — Telephone Encounter (Signed)
Correction, pt notified that RX was sent to his pharmacy

## 2019-10-03 NOTE — Telephone Encounter (Signed)
Rx sent 

## 2019-10-03 NOTE — Telephone Encounter (Signed)
Refill request received from [facility] for Pantoprazole 40 mg, #90, take 1 tab po 30 mins before breakfast.

## 2019-10-05 ENCOUNTER — Telehealth: Payer: Self-pay | Admitting: Gastroenterology

## 2019-10-05 NOTE — Telephone Encounter (Signed)
Pt said he was returning a call. [phone]

## 2019-10-05 NOTE — Telephone Encounter (Signed)
Spoke with pt. He was notified yesterday that his RX was sent to the pharmacy.

## 2019-10-10 ENCOUNTER — Other Ambulatory Visit: Payer: Self-pay

## 2019-10-10 ENCOUNTER — Other Ambulatory Visit (HOSPITAL_COMMUNITY)
Admission: RE | Admit: 2019-10-10 | Discharge: 2019-10-10 | Disposition: A | Discharge status: Home / Self Care | Payer: BC Managed Care – PPO | Source: Ambulatory Visit | Attending: Internal Medicine | Admitting: Internal Medicine

## 2019-10-10 DIAGNOSIS — I82409 Acute embolism and thrombosis of unspecified deep veins of unspecified lower extremity: Secondary | ICD-10-CM | POA: Insufficient documentation

## 2019-10-10 LAB — PROTIME-INR
INR: 2.8 — ABNORMAL HIGH (ref 0.8–1.2)
Prothrombin Time: 29.4 seconds — ABNORMAL HIGH (ref 11.4–15.2)

## 2020-05-19 ENCOUNTER — Ambulatory Visit: Payer: BC Managed Care – PPO | Admitting: Psychology

## 2020-05-22 ENCOUNTER — Ambulatory Visit (INDEPENDENT_AMBULATORY_CARE_PROVIDER_SITE_OTHER): Payer: BC Managed Care – PPO | Admitting: Psychology

## 2020-05-22 DIAGNOSIS — F4323 Adjustment disorder with mixed anxiety and depressed mood: Secondary | ICD-10-CM | POA: Diagnosis not present

## 2020-06-05 ENCOUNTER — Ambulatory Visit (INDEPENDENT_AMBULATORY_CARE_PROVIDER_SITE_OTHER): Payer: BC Managed Care – PPO | Admitting: Psychology

## 2020-06-05 DIAGNOSIS — F4323 Adjustment disorder with mixed anxiety and depressed mood: Secondary | ICD-10-CM | POA: Diagnosis not present

## 2020-06-19 ENCOUNTER — Ambulatory Visit (INDEPENDENT_AMBULATORY_CARE_PROVIDER_SITE_OTHER): Payer: BC Managed Care – PPO | Admitting: Psychology

## 2020-06-19 DIAGNOSIS — F4323 Adjustment disorder with mixed anxiety and depressed mood: Secondary | ICD-10-CM

## 2020-06-24 ENCOUNTER — Other Ambulatory Visit: Payer: Self-pay

## 2020-06-24 ENCOUNTER — Ambulatory Visit
Admission: EM | Admit: 2020-06-24 | Discharge: 2020-06-24 | Disposition: A | Discharge status: Home / Self Care | Payer: BC Managed Care – PPO

## 2020-06-24 DIAGNOSIS — S0502XA Injury of conjunctiva and corneal abrasion without foreign body, left eye, initial encounter: Secondary | ICD-10-CM

## 2020-06-24 DIAGNOSIS — S0592XA Unspecified injury of left eye and orbit, initial encounter: Secondary | ICD-10-CM

## 2020-06-24 MED ORDER — OFLOXACIN 0.3 % OP SOLN: OPHTHALMIC | 0 refills | Status: DC

## 2020-06-24 NOTE — ED Triage Notes (Signed)
Pt pulling vines and was hit in left eye, eye is red but no pain or vision disturbance

## 2020-06-24 NOTE — ED Provider Notes (Signed)
[place]   [id number] 06/24/20 Arrival Time: 4193  CC: Red eye  SUBJECTIVE:  Carl [name] is a 64 y.o. male who presents with complaint of eye redness that began today.  States he was clearing vines and a vine hit him in the eye.  Denies alleviating or aggravating factors.  Denies similar symptoms in the past.  Denies fever, chills, nausea, vomiting, eye pain, painful eye movements, discharge, itching, vision changes, double vision, FB sensation, periorbital erythema.     Denies contact lens use.    ROS: As per HPI.  All other pertinent ROS negative.     Past Medical History:  Diagnosis Date  . Carpal tunnel syndrome    left  . Diabetes mellitus    diet controlled  . Factor 5 Leiden mutation, heterozygous ([place])   . Hyperlipidemia   . Hypertension   . OSA (obstructive sleep apnea)    wears CPAP nightly   Past Surgical History:  Procedure Laterality Date  . ANKLE SURGERY  1996   Left  . CARPAL TUNNEL RELEASE Left 04/08/2015   Procedure: LEFT CARPAL TUNNEL RELEASE;  Surgeon: Daryll Brod, MD;  Location: [place];  Service: Orthopedics;  Laterality: Left;  . CARPAL TUNNEL RELEASE Right 05/06/2015   Procedure: RIGHT CARPAL TUNNEL RELEASE;  Surgeon: Daryll Brod, MD;  Location: [place];  Service: Orthopedics;  Laterality: Right;  ANESTHESIA:  IV REGIONAL FAB  . COLONOSCOPY  02/2010   Dr. Oneida Alar: Extremely redundant and tortuous sigmoid and transverse colon.  Normal ileocecal valve, cecum could not be intubated.  Noted to have diverticulosis.  Advised to have follow-up screening colonoscopy in 5 years at [facility] overtube and fluoroscopy.  . ESOPHAGOGASTRODUODENOSCOPY N/A 03/09/2018   Procedure: ESOPHAGOGASTRODUODENOSCOPY (EGD);  Surgeon: Danie Binder, MD;  [facility];  Service: Endoscopy;  Laterality: N/A;  8:30am  . HERNIA REPAIR    . INSERTION OF MESH N/A 04/10/2014   Procedure:  INSERTION OF MESH;  Surgeon: Jamesetta So, MD;  Location: [facility];  Service: General;  Laterality: N/A;  . SAVORY DILATION N/A 03/09/2018   Procedure: SAVORY DILATION;  Surgeon: Danie Binder, MD;  [facility];  Service: Endoscopy;  Laterality: N/A;  . UMBILICAL HERNIA REPAIR N/A 04/10/2014   Procedure: HERNIA REPAIR UMBILICAL ADULT;  Surgeon: Jamesetta So, MD;  Location: [facility];  Service: General;  Laterality: N/A;   Allergies  Allergen Reactions  . Amoxicillin Other (See Comments)    Joints ache and can't move Has patient had a PCN reaction causing immediate rash, facial/tongue/throat swelling, SOB or lightheadedness with hypotension: No Has patient had a PCN reaction causing severe rash involving mucus membranes or skin necrosis: No Has patient had a PCN reaction that required hospitalization: No Has patient had a PCN reaction occurring within the last 10 years: Yes If all of the above answers are "NO", then may proceed with Cephalosporin use.     No current facility-administered medications on file prior to encounter.   Current Outpatient Medications on File Prior to Encounter  Medication Sig Dispense Refill  . amLODipine-benazepril (LOTREL) 10-40 MG capsule Take 1 capsule by mouth daily.     . Cholecalciferol (VITAMIN D3 PO) Take 50 mcg by mouth daily.    Marland Kitchen escitalopram (LEXAPRO) 10 MG tablet Take by mouth.    Marland Kitchen ibuprofen (ADVIL,MOTRIN) 200 MG tablet Take 400 mg by mouth every 6 (six) hours as needed for  moderate pain.    Marland Kitchen oxymetazoline (AFRIN) 0.05 % nasal spray Place 2 sprays into both nostrils at bedtime.     . pantoprazole (PROTONIX) 40 MG tablet 1 po 30 mins prior to first meal 90 tablet 3  . traZODone (DESYREL) 50 MG tablet Take 50 mg by mouth at bedtime as needed.    . warfarin (COUMADIN) 5 MG tablet Take 5 mg by mouth at bedtime.     Social History   Socioeconomic History  . Marital status: Married    Spouse name: Nevin Bloodgood  . Number of children: Y  .  Years of education: Not on file  . Highest education level: Not on file  Occupational History  . Occupation: med Editor, commissioning  Tobacco Use  . Smoking status: Never Smoker  . Smokeless tobacco: Never Used  Vaping Use  . Vaping Use: Never used  Substance and Sexual Activity  . Alcohol use: Yes    Comment: occ  . Drug use: No  . Sexual activity: Yes  Other Topics Concern  . Not on file  Social History Narrative   RETIRED FROM [place]. [employer].   Social Determinants of Health   Financial Resource Strain:   . Difficulty of Paying Living Expenses: Not on file  Food Insecurity:   . Worried About Charity fundraiser in the Last Year: Not on file  . Ran Out of Food in the Last Year: Not on file  Transportation Needs:   . Lack of Transportation (Medical): Not on file  . Lack of Transportation (Non-Medical): Not on file  Physical Activity:   . Days of Exercise per Week: Not on file  . Minutes of Exercise per Session: Not on file  Stress:   . Feeling of Stress : Not on file  Social Connections:   . Frequency of Communication with Friends and Family: Not on file  . Frequency of Social Gatherings with Friends and Family: Not on file  . Attends Religious Services: Not on file  . Active Member of Clubs or Organizations: Not on file  . Attends Archivist Meetings: Not on file  . Marital Status: Not on file  Intimate Partner Violence:   . Fear of Current or Ex-Partner: Not on file  . Emotionally Abused: Not on file  . Physically Abused: Not on file  . Sexually Abused: Not on file   Family History  Problem Relation Age of Onset  . Heart disease Mother   . Rheum arthritis Mother   . Clotting disorder Father   . Rheum arthritis Sister   . Colon cancer Neg Hx   . Colon polyps Neg Hx     OBJECTIVE:   Vitals:   06/24/20 1451  BP: 120/79  Pulse: 65  Resp: 18  Temp: 98.9 F (37.2 C)  SpO2: 95%    General appearance: alert; no distress Eyes: + conjunctival  erythema in 4-5 o'clock position of LT eye. PERRL; EOMI without discomfort;  no obvious drainage ENT: EACs clear, TMs pearly gray; nares patent; oropharynx clear Neck: supple Lungs: clear to auscultation bilaterally Heart: regular rate and rhythm Skin: warm and dry Psychological: alert and cooperative; normal mood and affect   ASSESSMENT & PLAN:  1. Abrasion of left cornea, initial encounter   2. Left eye injury, initial encounter     Meds ordered this encounter  Medications  . ofloxacin (OCUFLOX) 0.3 % ophthalmic solution    Sig: Instill 1 to 2 drops in affected eye(s) every 2 to  4 hours for the first 2 days, then instill 1 to 2 drops 4 times daily for an additional 5 days    Dispense:  10 mL    Refill:  0    Order Specific Question:   Supervising Provider    Answer:   Raylene Everts [[id number]]   Due to injury and physical exam will cover for corneal abrasion Use ofloxacin as prescribed and to completion Use OTC systane or genteal gel eye drops at night as needed for symptomatic relief Use OTC ibuprofen or tylenol as needed for pain relief Return here or follow up with ophthalmology if symptoms persists or worsen such as fever, chills, redness, swelling, eye pain, painful eye movements, vision changes, etc...   Reviewed expectations re: course of current medical issues. Questions answered. Outlined signs and symptoms indicating need for more acute intervention. Patient verbalized understanding. After Visit Summary given.   Lestine Box, PA-C 06/24/20 1531

## 2020-06-24 NOTE — Discharge Instructions (Signed)
Due to injury and physical exam will cover for corneal abrasion Use ofloxacin as prescribed and to completion Use OTC systane or genteal gel eye drops at night as needed for symptomatic relief Use OTC ibuprofen or tylenol as needed for pain relief Return here or follow up with ophthalmology if symptoms persists or worsen such as fever, chills, redness, swelling, eye pain, painful eye movements, vision changes, etc..Marland Kitchen

## 2020-07-03 ENCOUNTER — Ambulatory Visit: Payer: BC Managed Care – PPO | Admitting: Psychology

## 2020-07-10 ENCOUNTER — Ambulatory Visit (INDEPENDENT_AMBULATORY_CARE_PROVIDER_SITE_OTHER): Payer: BC Managed Care – PPO | Admitting: Psychology

## 2020-07-10 DIAGNOSIS — F4323 Adjustment disorder with mixed anxiety and depressed mood: Secondary | ICD-10-CM | POA: Diagnosis not present

## 2020-07-31 ENCOUNTER — Ambulatory Visit (INDEPENDENT_AMBULATORY_CARE_PROVIDER_SITE_OTHER): Payer: BC Managed Care – PPO | Admitting: Psychology

## 2020-07-31 DIAGNOSIS — F4323 Adjustment disorder with mixed anxiety and depressed mood: Secondary | ICD-10-CM

## 2020-08-14 ENCOUNTER — Ambulatory Visit: Payer: BC Managed Care – PPO | Admitting: Psychology

## 2020-08-28 ENCOUNTER — Ambulatory Visit: Payer: BC Managed Care – PPO | Admitting: Psychology

## 2020-09-03 ENCOUNTER — Other Ambulatory Visit: Payer: Self-pay | Admitting: Gastroenterology

## 2020-09-04 NOTE — Telephone Encounter (Signed)
Please let patient know I am sending in a 3 month supply of pantoprazole.  He will need an office visit for further refills.

## 2020-09-05 NOTE — Telephone Encounter (Signed)
Spoke with pt and scheduled apt for 11/26/19. Pt is aware that his medication was sent to his pharmacy.

## 2020-10-20 ENCOUNTER — Encounter: Payer: Self-pay | Admitting: Gastroenterology

## 2020-11-25 ENCOUNTER — Ambulatory Visit: Payer: BC Managed Care – PPO | Admitting: Gastroenterology

## 2020-11-28 ENCOUNTER — Other Ambulatory Visit: Payer: Self-pay | Admitting: Gastroenterology

## 2020-12-02 ENCOUNTER — Ambulatory Visit: Payer: BC Managed Care – PPO | Admitting: Gastroenterology

## 2020-12-02 ENCOUNTER — Telehealth: Payer: Self-pay | Admitting: Gastroenterology

## 2020-12-02 ENCOUNTER — Other Ambulatory Visit: Payer: Self-pay

## 2020-12-02 ENCOUNTER — Encounter: Payer: Self-pay | Admitting: Gastroenterology

## 2020-12-02 VITALS — BP 126/76 | HR 62 | Temp 97.1°F | Ht 69.0 in | Wt 248.4 lb

## 2020-12-02 DIAGNOSIS — K219 Gastro-esophageal reflux disease without esophagitis: Secondary | ICD-10-CM | POA: Diagnosis not present

## 2020-12-02 DIAGNOSIS — R1319 Other dysphagia: Secondary | ICD-10-CM | POA: Diagnosis not present

## 2020-12-02 MED ORDER — PANTOPRAZOLE SODIUM 40 MG PO TBEC: DELAYED_RELEASE_TABLET | ORAL | 3 refills | Status: DC

## 2020-12-02 NOTE — Progress Notes (Signed)
Primary Care Physician:  [name], MD  Primary Gastroenterologist:  Formerly Barney Drain, MD   Chief Complaint  Patient presents with  . Medication Refill    Needs Protonix    HPI:  Carl [name] is a 65 y.o. male here for follow-up.  Last seen October 2019.  History of GERD and esophageal dysphagia, due colonoscopy last year for 10-year screening.  Patient takes coumadin daily for history of factor V Leyden mutation, DVT.  EGD 2019 with benign-appearing esophageal stricture due to acid reflux, small hiatal hernia, mild gastritis with benign biopsies.  No H. pylori.  Last colonoscopy in 2019: Severe left-sided diverticulosis, small internal hemorrhoids.  Repeat screening colonoscopy in 10 years.  Clinically doing well.  No longer having any heartburn or reflux.  No swallowing concerns.  No abdominal pain or vomiting.  Bowel movements regular, variety of [place] stool numbers based on what he eats.  No excessive diarrhea or constipation.  No melena or rectal bleeding.  No unintentional weight loss.    Current Outpatient Medications  Medication Sig Dispense Refill  . amLODipine (NORVASC) 10 MG tablet Take 10 mg by mouth daily.    . Ascorbic Acid (VITAMIN C PO) Take by mouth daily.    . Cholecalciferol (VITAMIN D3 PO) Take 50 mcg by mouth daily.    Marland Kitchen escitalopram (LEXAPRO) 10 MG tablet Take 10 mg by mouth daily.    Marland Kitchen ibuprofen (ADVIL,MOTRIN) 200 MG tablet Take 400 mg by mouth every 6 (six) hours as needed for moderate pain.    Marland Kitchen oxymetazoline (AFRIN) 0.05 % nasal spray Place 2 sprays into both nostrils at bedtime.     . pantoprazole (PROTONIX) 40 MG tablet TAKE 1 TABLET BY MOUTH EVERY MORNING 30 MINUTES PRIOR TO FIRST MEAL 30 tablet 0  . traZODone (DESYREL) 50 MG tablet Take 50 mg by mouth at bedtime as needed.    . valsartan (DIOVAN) 160 MG tablet Take 160 mg by mouth daily.    Marland Kitchen warfarin (COUMADIN) 5 MG tablet Take 2.5-5 mg by mouth at bedtime. Alternates 5mg   one day then 2.5mg   for two days     No current facility-administered medications for this visit.    Allergies as of 12/02/2020 - Review Complete 12/02/2020  Allergen Reaction Noted  . Amoxicillin Other (See Comments) 04/04/2014    ROS:  General: Negative for anorexia, weight loss, fever, chills, fatigue, weakness. Eyes: Negative for vision changes.  ENT: Negative for hoarseness, difficulty swallowing , nasal congestion. CV: Negative for chest pain, angina, palpitations, dyspnea on exertion, peripheral edema.  Respiratory: Negative for dyspnea at rest, dyspnea on exertion, cough, sputum, wheezing.  GI: See history of present illness. GU:  Negative for dysuria, hematuria, urinary incontinence, urinary frequency, nocturnal urination.  MS: Negative for joint pain, low back pain.  Derm: Negative for rash or itching.  Neuro: Negative for weakness, abnormal sensation, seizure, frequent headaches, memory loss, confusion.  Psych: Negative for anxiety, depression, suicidal ideation, hallucinations.  Endo: Negative for unusual weight change.  Heme: Negative for bruising or bleeding. Allergy: Negative for rash or hives.    Physical Examination:  BP 126/76   Pulse 62   Temp (!) 97.1 F (36.2 C) (Temporal)   Ht 5\' 9"  (1.753 m)   Wt 248 lb 6.4 oz (112.7 kg)   BMI 36.68 kg/m    General: Well-nourished, well-developed in no acute distress.  Head: Normocephalic, atraumatic.   Eyes: Conjunctiva pink, no icterus. Mouth: masked Neck: Supple without thyromegaly, masses,  or lymphadenopathy.  Abdomen: Bowel sounds are normal, nontender, nondistended, no hepatosplenomegaly or masses, no abdominal bruits or    hernia , no rebound or guarding.   Rectal: not performed Extremities: No lower extremity edema. No clubbing or deformities.  Neuro: Alert and oriented x 4 , grossly normal neurologically.  Skin: Warm and dry, no rash or jaundice.   Psych: Alert and cooperative, normal mood and affect.  Imaging  Studies: No results found.  Assessment/plan:  Pleasant 65 year old male with history of GERD, esophageal stricture presenting for follow-up.  Clinically doing well.  We discussed pros and cons of chronic PPI therapy.  We discussed possibility of stopping therapy to see if he has recurrent reflux on a regular basis and/or recurrent dysphagia.  Patient states he is not interested in stopping medication at this time because he is doing so well and his symptoms were significant in the past.  Continue pantoprazole 40 mg daily before breakfast.  Refills provided.  We will continue to see him every 2 years or sooner if needed.  He reports new family history of colon cancer, nephew (sister's son) in his early 63s.  No other relatives with colon cancer.  Based on this finding, patient remains average risk for colon cancer therefore next colonoscopy would still be June 2029.  Previously difficult to examine colon, last colonoscopy completed at [facility]

## 2020-12-02 NOTE — Telephone Encounter (Signed)
Please NIC for colonoscopy in 02/2028. Last one done at [facility] due to difficult exam. When time comes Dr. Abbey Chatters can decide if patient to have done locally or at [facility].

## 2020-12-02 NOTE — Patient Instructions (Signed)
1. Continue pantoprazole 40 mg 30 minutes before breakfast each morning.  New prescription sent to your pharmacy. 2. We will see back in 2 years or sooner if needed. 3. Your next colonoscopy should be in June 2029 at [facility], if you have more family members develop colon cancer, please let us know as this may adjust timing of your future colonoscopy.

## 2020-12-03 NOTE — Progress Notes (Signed)
Cc'ed to pcp °

## 2021-06-03 ENCOUNTER — Ambulatory Visit: Payer: BC Managed Care – PPO | Admitting: Podiatry

## 2021-06-05 ENCOUNTER — Ambulatory Visit (INDEPENDENT_AMBULATORY_CARE_PROVIDER_SITE_OTHER): Payer: Medicare Other

## 2021-06-05 ENCOUNTER — Ambulatory Visit (INDEPENDENT_AMBULATORY_CARE_PROVIDER_SITE_OTHER): Payer: Medicare Other | Admitting: Podiatry

## 2021-06-05 ENCOUNTER — Other Ambulatory Visit: Payer: Self-pay

## 2021-06-05 ENCOUNTER — Encounter: Payer: Self-pay | Admitting: Podiatry

## 2021-06-05 DIAGNOSIS — M79671 Pain in right foot: Secondary | ICD-10-CM

## 2021-06-05 DIAGNOSIS — M778 Other enthesopathies, not elsewhere classified: Secondary | ICD-10-CM | POA: Diagnosis not present

## 2021-06-05 DIAGNOSIS — M779 Enthesopathy, unspecified: Secondary | ICD-10-CM

## 2021-06-05 DIAGNOSIS — M79672 Pain in left foot: Secondary | ICD-10-CM

## 2021-06-05 DIAGNOSIS — M205X1 Other deformities of toe(s) (acquired), right foot: Secondary | ICD-10-CM

## 2021-06-05 MED ORDER — PREDNISONE 10 MG PO TABS: ORAL_TABLET | ORAL | 0 refills | Status: DC

## 2021-06-05 MED ORDER — TRIAMCINOLONE ACETONIDE 10 MG/ML IJ SUSP
20.0000 mg | Freq: Once | INTRAMUSCULAR | Status: AC
  Administered 2021-06-05: 20 mg

## 2021-06-05 NOTE — Progress Notes (Signed)
Subjective:   Patient ID: Carl [name], male   DOB: 65 y.o.   MRN: [id number]   HPI Patient presents stating he has a lot of pain in both feet.  States that he gets a lot of pain in the ball of his left foot and the right heel and arch and states its been going on for at least 6 months.  He does not remember any other pathology and both feet hurt in general but these 2 areas of become very tender and made it hard for him to walk.  Patient does not currently smoke and would like to be more active    Review of Systems  All other systems reviewed and are negative.      Objective:  Physical Exam Vitals and nursing note reviewed.  Constitutional:      Appearance: He is well-developed.  Pulmonary:     Effort: Pulmonary effort is normal.  Musculoskeletal:        General: Normal range of motion.  Skin:    General: Skin is warm.  Neurological:     Mental Status: He is alert.    Neurovascular status intact muscle strength adequate range of motion within normal limits with patient found to have exquisite discomfort in the right plantar fascia in the proximal portion and into the second MPJ with inflammation fluid buildup.  Generalized foot pain generalized other pains but mostly the feet in general.  Patient has good digital perfusion well oriented x3     Assessment:  Acute plantar fasciitis right inflammation fluid moderate inflammation forefoot left concentrated around the second MPJ     Plan:  H&P condition reviewed and recommended treatment.  I went ahead today Carl [name] to focus on both areas and I did do sterile prep injected the plantar fascial right 3 mg Kenalog 5 mg Xylocaine and for the left I injected the second MPJ after doing a proximal nerve block aspirated the joint getting out a small amount of clear fluid injected quarter cc dexamethasone Kenalog and applied thick plantar pad to reduce pressure on the joint surface.  Reappoint in the next 6 weeks may require other treatments  including orthotics  X-rays indicate that there is moderate changes on both feet no other indications of pathology

## 2021-06-12 ENCOUNTER — Other Ambulatory Visit: Payer: Self-pay | Admitting: Podiatry

## 2021-06-12 DIAGNOSIS — M779 Enthesopathy, unspecified: Secondary | ICD-10-CM

## 2021-06-25 ENCOUNTER — Telehealth: Payer: Self-pay | Admitting: *Deleted

## 2021-06-25 NOTE — Telephone Encounter (Signed)
Patient's wife is calling because they were supposed to pick up prescription for Prednisone but there was a mix up at pharmacy, just aware it was there today,. Patient said that he is much better w/o taking medicine.Does doctor still want patient to take? Please advise.

## 2021-06-26 NOTE — Telephone Encounter (Signed)
Returned the call to patient, no answer, left vmessage of doctor's message concerning medication and that he does not need to start Prednisone at this time.

## 2021-07-17 ENCOUNTER — Encounter: Payer: Self-pay | Admitting: Podiatry

## 2021-07-17 ENCOUNTER — Ambulatory Visit: Payer: Medicare Other | Admitting: Podiatry

## 2021-07-17 ENCOUNTER — Other Ambulatory Visit: Payer: Self-pay

## 2021-07-17 DIAGNOSIS — M778 Other enthesopathies, not elsewhere classified: Secondary | ICD-10-CM | POA: Diagnosis not present

## 2021-07-17 DIAGNOSIS — M722 Plantar fascial fibromatosis: Secondary | ICD-10-CM

## 2021-07-20 NOTE — Progress Notes (Signed)
Subjective:   Patient ID: Carl [name], male   DOB: 65 y.o.   MRN: [id number]   HPI Patient presents stating he is having bilateral pain in his heels and states it is much better than it was and he is very pleased at this point.  Occasional discomfort in the right   ROS      Objective:  Physical Exam  Neurovascular status found to be intact muscle strength is found to be adequate range of motion adequate with moderate depression of the arch and diminished discomfort in the plantar heel region bilateral with inflammation upon deep palpation but much improved from where it was previously.     Assessment:  Acute fasciitis like symptoms improve mild forefoot inflammation which is probably compensatory and should continue to improve     Plan:  H&P reviewed both conditions both heel and forefoot and recommended continuation of shoe gear modifications oral anti-inflammatories topical medicines for the forefoot as needed and physical therapy.  Patient is discharged but may need future treatment depending on response and patient will be seen back to recheck depending on symptoms

## 2021-08-18 IMAGING — US US EXTREM LOW VENOUS*L*
1 series · 13 of 24 positions shown · non-contrast
Comparison: None.

CLINICAL DATA: History of left popliteal DVT. Evaluate for acute or
chronic DVT.



[Series 1: us extrem low venous*left* · 13 of 40 slices shown]
[im 1/40]
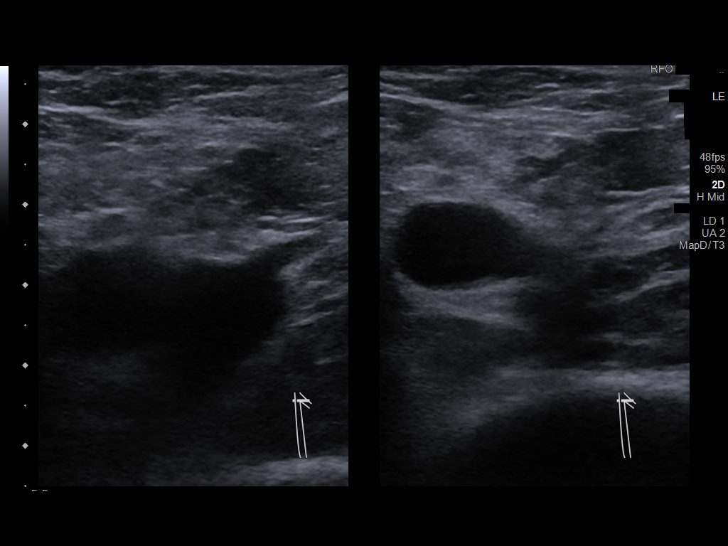
[im 4/40]
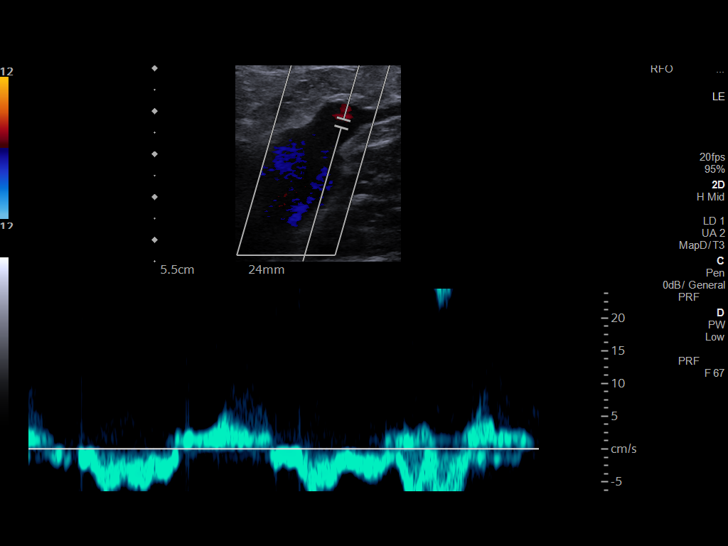
[im 7/40]
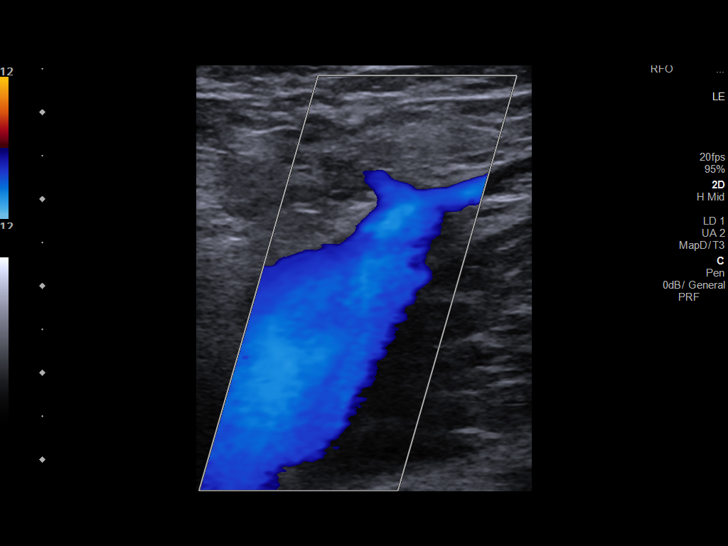
[im 11/40]
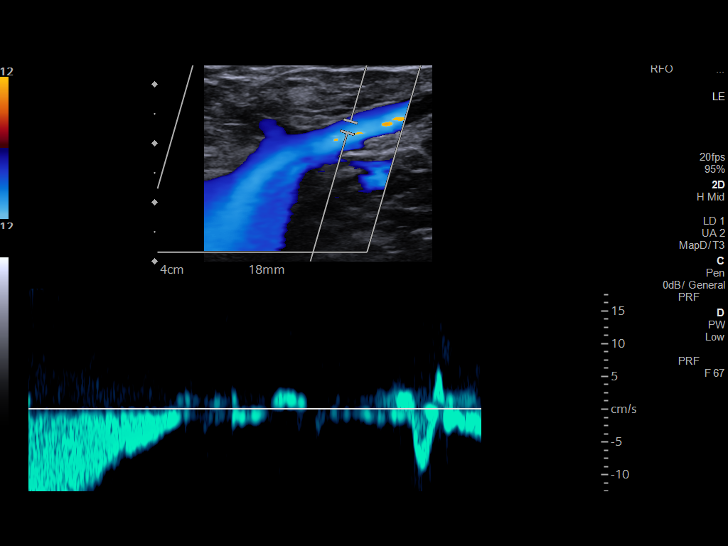
[im 14/40]
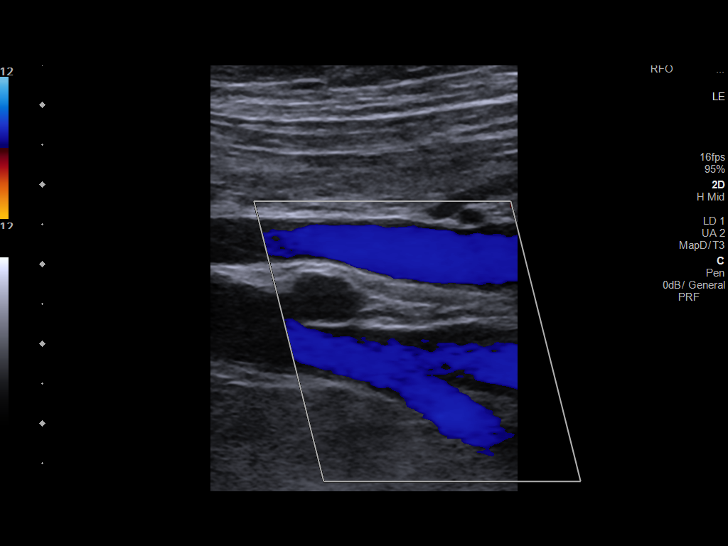
[im 17/40]
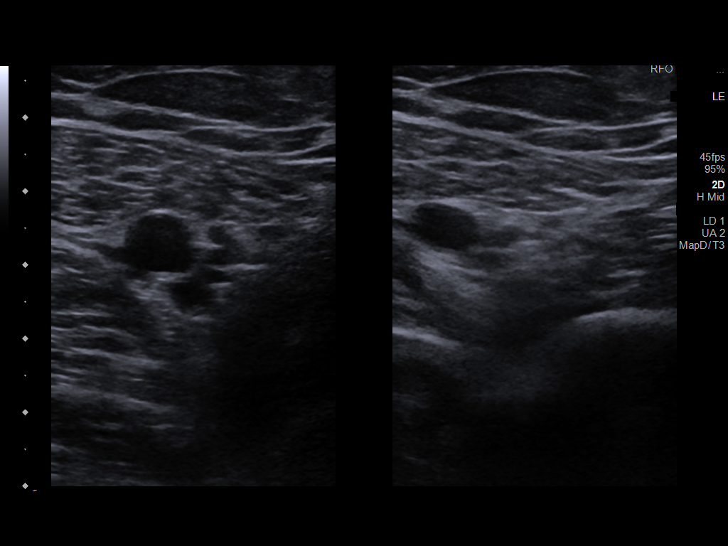
[im 21/40]
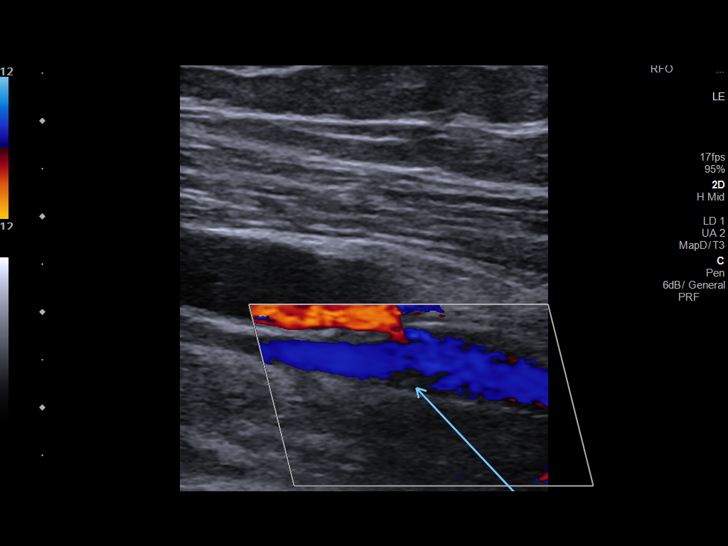
[im 23/40]
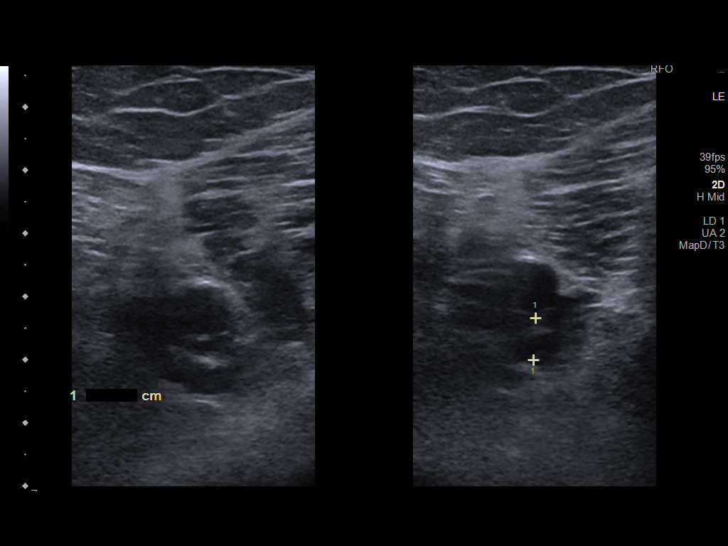
[im 26/40]
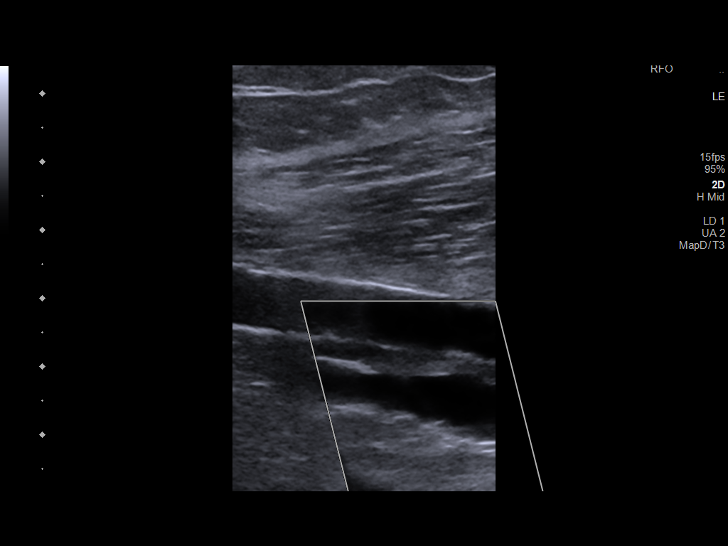
[im 29/40]
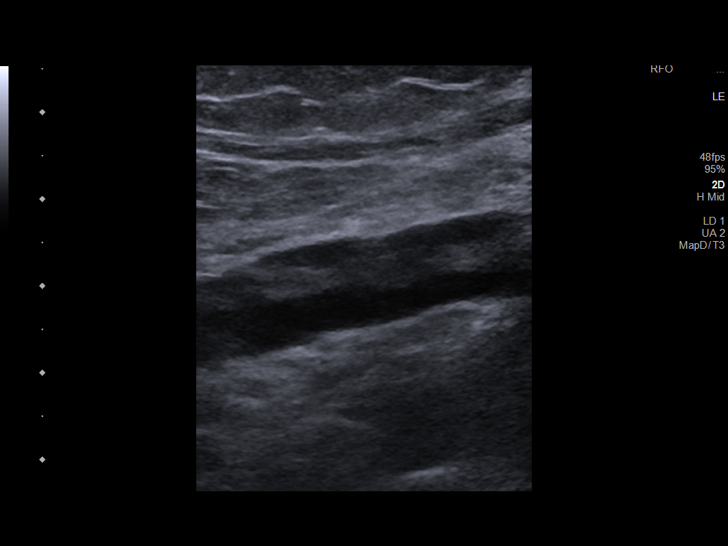
[im 33/40]
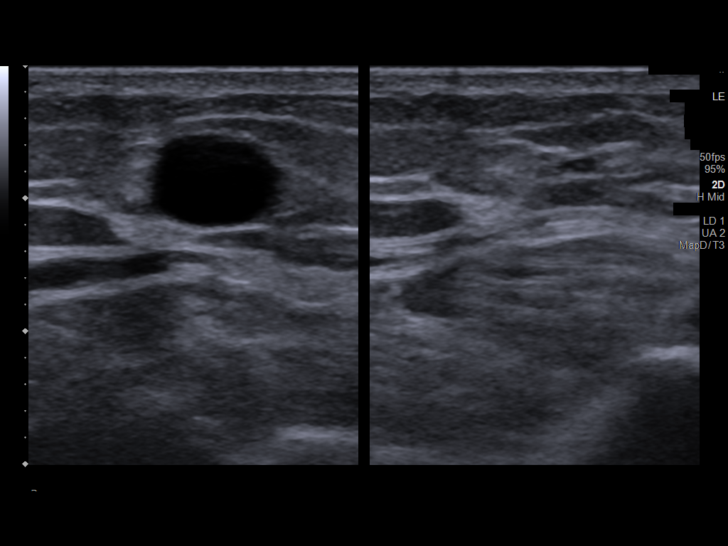
[im 36/40]
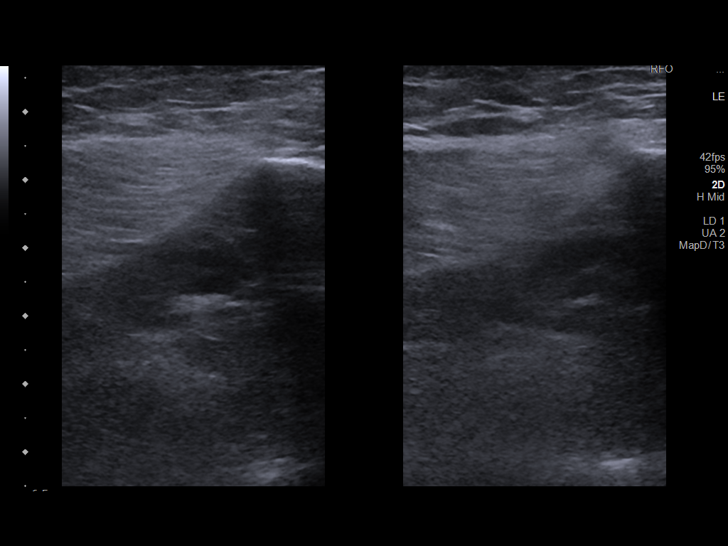
[im 40/40]
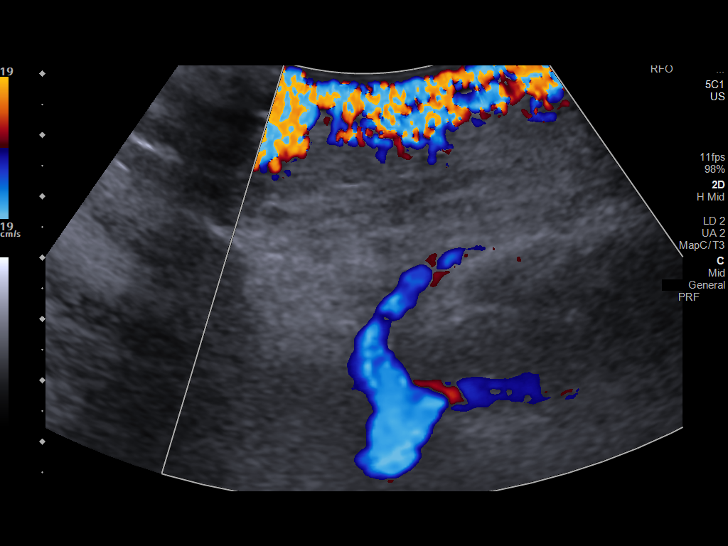

[13 of 24 positions shown; findings below may reference images not displayed]

FINDINGS: Contralateral Common Femoral Vein: Respiratory phasicity is normal
and symmetric with the symptomatic side. No evidence of thrombus.
Normal compressibility.

Common Femoral Vein: No evidence of thrombus. Normal
compressibility, respiratory phasicity and response to augmentation.

Saphenofemoral Junction: No evidence of thrombus. Normal
compressibility and flow on color Doppler imaging.

Profunda Femoral Vein: No evidence of thrombus. Normal
compressibility and flow on color Doppler imaging.

Femoral Vein: While the proximal aspect of the left femoral vein is
widely patent, there is hypoechoic nonocclusive wall
thickening/chronic DVT involving the mid (images 20 and 21) and
distal (images 22 and 24) aspects of the left femoral vein.

Popliteal Vein: There is grossly unchanged hypoechoic nonocclusive
thrombus involving the left popliteal vein (images 29 and 32),
similar to the [DATE] examination.

Calf Veins: There is age-indeterminate occlusive DVT involving the
left posterior tibial vein (image 38). The left peroneal vein was
not imaged. The anterior tibial vein appears patent where imaged.

Superficial Great Saphenous Vein: No evidence of thrombus. Normal
compressibility.

Other Findings:  None.
IMPRESSION: 1. Age-indeterminate, though potentially acute, occlusive DVT
involving the left posterior tibial vein.
2. Chronic nonocclusive DVT involving the mid and distal aspects of
the left femoral vein as well as the left popliteal vein, progressed
compared to the [DATE] examination.

## 2021-08-19 ENCOUNTER — Encounter: Payer: Self-pay | Admitting: *Deleted

## 2021-08-19 ENCOUNTER — Encounter: Payer: Self-pay | Admitting: Cardiology

## 2021-08-19 ENCOUNTER — Ambulatory Visit: Payer: Medicare Other | Admitting: Cardiology

## 2021-08-19 VITALS — BP 127/83 | HR 77 | Ht 69.0 in | Wt 234.2 lb

## 2021-08-19 DIAGNOSIS — R7303 Prediabetes: Secondary | ICD-10-CM | POA: Diagnosis not present

## 2021-08-19 DIAGNOSIS — R079 Chest pain, unspecified: Secondary | ICD-10-CM

## 2021-08-19 DIAGNOSIS — R0789 Other chest pain: Secondary | ICD-10-CM

## 2021-08-19 DIAGNOSIS — R55 Syncope and collapse: Secondary | ICD-10-CM

## 2021-08-19 DIAGNOSIS — I10 Essential (primary) hypertension: Secondary | ICD-10-CM

## 2021-08-19 NOTE — Patient Instructions (Addendum)
Medication Instructions:  Your physician recommends that you continue on your current medications as directed. Please refer to the Current Medication list given to you today.  Labwork: none  Testing/Procedures: Your physician has requested that you have an echocardiogram. Echocardiography is a painless test that uses sound waves to create images of your heart. It provides your doctor with information about the size and shape of your heart and how well your heart's chambers and valves are working. This procedure takes approximately one hour. There are no restrictions for this procedure. Your physician has requested that you have en exercise stress myoview. For further information please visit [email or web address]. Please follow instruction sheet, as given.  Follow-Up: Your physician recommends that you schedule a follow-up appointment in: pending  Any Other Special Instructions Will Be Listed Below (If Applicable).  If you need a refill on your cardiac medications before your next appointment, please call your pharmacy.

## 2021-08-19 NOTE — Progress Notes (Signed)
Cardiology Office Note  Date: 08/19/2021   ID: Carl [name], DOB Apr 28, 1956, MRN [id number]  PCP:  [name], MD  Cardiologist:  Rozann Lesches, MD Electrophysiologist:  None   Chief Complaint  Patient presents with   Near Syncope   Chest discomfort     History of Present Illness: Carl [name] is a 65 y.o. male referred for cardiology consultation by Dr. Willey Blade for evaluation of chest pain and palpitations.  He is here today with his daughter.  He tells me that for the last 2 years he has been having episodic dizziness and near syncope, can happen when he stands up from a seated position or if he is outside working.  He is active, works as a Neurosurgeon, also regular outside chores.  He also feels intermittent sense of fast heartbeat and chest tightness when he is active, indicates that his heart rate can be as fast as 150 by his Fitbit.  Usually, if he stops and sits down the symptoms improved.  He has never had frank syncope.  He does not report any sudden elevation in heart rate while at rest.  He has a history of prediabetes, OSA on CPAP, essential hypertension, and hyperlipidemia.  He reports compliance with medical therapy and use of CPAP.  He also has factor V Leiden mutation with prior recurrent DVT and is on chronic Coumadin for the last 2 years followed by Dr. Willey Blade.  He has never had a documented pulmonary embolus.  I personally reviewed a faxed copy of ECG from August obtained at PCP office showing sinus rhythm.  Follow-up ECG today is normal.  I reviewed his medications which are noted below.  Orthostatics were obtained today and were normal.  Past Medical History:  Diagnosis Date   Carpal tunnel syndrome    Essential hypertension    Factor 5 Leiden mutation, heterozygous ([place])    History of DVT (deep vein thrombosis) 2020   Left leg   Hyperlipidemia    OSA on CPAP    Type 2 diabetes mellitus ([place])     Past Surgical History:  Procedure Laterality  Date   ANKLE SURGERY  1996   Left   CARPAL TUNNEL RELEASE Left 04/08/2015   Procedure: LEFT CARPAL TUNNEL RELEASE;  Surgeon: Daryll Brod, MD;  Location: [place];  Service: Orthopedics;  Laterality: Left;   CARPAL TUNNEL RELEASE Right 05/06/2015   Procedure: RIGHT CARPAL TUNNEL RELEASE;  Surgeon: Daryll Brod, MD;  Location: [place];  Service: Orthopedics;  Laterality: Right;  ANESTHESIA:  IV REGIONAL FAB   COLONOSCOPY  02/2010   Dr. Oneida Alar: Extremely redundant and tortuous sigmoid and transverse colon.  Normal ileocecal valve, cecum could not be intubated.  Noted to have diverticulosis.  Advised to have follow-up screening colonoscopy in 5 years at [facility] overtube and fluoroscopy.   ESOPHAGOGASTRODUODENOSCOPY N/A 03/09/2018   Fields: Benign-appearing esophageal stricture, small hiatal hernia, mild gastritis with benign biopsies.  No H. pylori.   HERNIA REPAIR     INSERTION OF MESH N/A 04/10/2014   Procedure: INSERTION OF MESH;  Surgeon: Jamesetta So, MD;  [facility];  Service: General;  Laterality: N/A;   SAVORY DILATION N/A 03/09/2018   Procedure: SAVORY DILATION;  Surgeon: Danie Binder, MD;  [facility];  Service: Endoscopy;  Laterality: N/A;   UMBILICAL HERNIA REPAIR N/A 04/10/2014   Procedure: HERNIA REPAIR UMBILICAL ADULT;  Surgeon: Jamesetta So, MD;  Location: [facility];  Service: General;  Laterality: N/A;    Current Outpatient Medications  Medication Sig Dispense Refill   amLODipine (NORVASC) 10 MG tablet Take 10 mg by mouth daily.     Ascorbic Acid (VITAMIN C PO) Take by mouth daily.     Cholecalciferol (VITAMIN D3 PO) Take 50 mcg by mouth daily.     escitalopram (LEXAPRO) 10 MG tablet Take 10 mg by mouth daily.     oxymetazoline (AFRIN) 0.05 % nasal spray Place 2 sprays into both nostrils at bedtime.      pantoprazole (PROTONIX) 40 MG tablet TAKE 1 TABLET BY MOUTH EVERY MORNING 30 MINUTES PRIOR  TO FIRST MEAL 90 tablet 3   rosuvastatin (CRESTOR) 10 MG tablet Take 10 mg by mouth at bedtime.     traZODone (DESYREL) 50 MG tablet Take 50 mg by mouth at bedtime as needed.     valsartan (DIOVAN) 160 MG tablet Take 160 mg by mouth daily.     warfarin (COUMADIN) 5 MG tablet Take 2.5-5 mg by mouth at bedtime. Alternates 5mg   one day then 2.5mg  for two days     predniSONE (DELTASONE) 10 MG tablet 12 day tapering dose (Patient not taking: Reported on 08/19/2021) 48 tablet 0   No current facility-administered medications for this visit.   Allergies:  Amoxicillin   Social History: The patient  reports that he has never smoked. He has never used smokeless tobacco. He reports current alcohol use. He reports that he does not use drugs.   Family History: The patient's family history includes COPD in his father; Clotting disorder in his father; Colon cancer in his nephew; Diabetes in his mother; Hypertension in his father; Rheum arthritis in his sister.   ROS: No orthopnea or PND.  Intermittent left leg swelling.  Physical Exam: VS:  BP 127/83 (BP Location: Right Arm, Cuff Size: Large)   Pulse 77   Ht 5\' 9"  (1.753 m)   Wt 234 lb 3.2 oz (106.2 kg)   SpO2 97%   BMI 34.59 kg/m , BMI Body mass index is 34.59 kg/m.  Wt Readings from Last 3 Encounters:  08/19/21 234 lb 3.2 oz (106.2 kg)  12/02/20 248 lb 6.4 oz (112.7 kg)  12/06/18 230 lb 12.8 oz (104.7 kg)    General: Patient appears comfortable at rest. HEENT: Conjunctiva and lids normal, wearing a mask. Neck: Supple, no elevated JVP or carotid bruits, no thyromegaly. Lungs: Clear to auscultation, nonlabored breathing at rest. Cardiac: Regular rate and rhythm, no S3 or significant systolic murmur, no pericardial rub. Abdomen: Soft, nontender, bowel sounds present. Extremities: Mild lower leg edema, distal pulses 2+. Skin: Warm and dry. Musculoskeletal: No kyphosis. Neuropsychiatric: Alert and oriented x3, affect grossly  appropriate.  ECG:  An ECG dated 04/07/2015 was personally reviewed today and demonstrated:  Sinus rhythm.  Recent Labwork:    Component Value Date/Time   CHOL 181 05/30/2019 0845   TRIG 82 05/30/2019 0845   HDL 45 05/30/2019 0845   CHOLHDL 4.0 05/30/2019 0845   VLDL 16 05/30/2019 0845   LDLCALC 120 (H) 05/30/2019 0845    Other Studies Reviewed Today:  No prior cardiac testing for review today.  Assessment and Plan:  1.  Intermittent sense of exertional chest tightness and rapid heart rate.  He also describes intermittent dizziness and near syncope with activity or sometimes just with standing, was not found to be orthostatic today.  Symptoms have been present for the last few years.  Cardiac risk  factors include age and gender, prediabetes, hypertension, and hyperlipidemia.  Baseline ECG reviewed.  He has not undergone any prior cardiac structural or ischemic testing.  We will proceed with an echocardiogram and exercise Myoview.  2.  OSA, he reports compliance with CPAP.  3.  Essential hypertension, on Norvasc and Diovan with reasonable control noted today.  4.  Factor V Leiden mutation with history of recurrent left leg DVT.  No prior history of pulmonary embolus.  He is on chronic Coumadin with followed by Dr. Willey Blade.  Medication Adjustments/Labs and Tests Ordered: Current medicines are reviewed at length with the patient today.  Concerns regarding medicines are outlined above.   Tests Ordered: Orders Placed This Encounter  Procedures   NM Myocar Multi W/Spect W/Wall Motion / EF   EKG 12-Lead   ECHOCARDIOGRAM COMPLETE     Medication Changes: No orders of the defined types were placed in this encounter.   Disposition:  Follow up  test results.  Signed, Satira Sark, MD, [facility] 08/19/2021 [place] [facility] [place], [place], [place] [address] Phone: [phone]; Fax: [phone]

## 2021-08-25 ENCOUNTER — Ambulatory Visit (HOSPITAL_COMMUNITY)
Admission: RE | Admit: 2021-08-25 | Discharge: 2021-08-25 | Disposition: A | Discharge status: Home / Self Care | Payer: Medicare Other | Source: Ambulatory Visit | Attending: Cardiology | Admitting: Cardiology

## 2021-08-25 ENCOUNTER — Other Ambulatory Visit: Payer: Self-pay

## 2021-08-25 ENCOUNTER — Encounter (HOSPITAL_COMMUNITY): Payer: Self-pay

## 2021-08-25 ENCOUNTER — Encounter (HOSPITAL_COMMUNITY)
Admission: RE | Admit: 2021-08-25 | Discharge: 2021-08-25 | Disposition: A | Discharge status: Home / Self Care | Payer: Medicare Other | Source: Ambulatory Visit | Attending: Cardiology | Admitting: Cardiology

## 2021-08-25 DIAGNOSIS — R931 Abnormal findings on diagnostic imaging of heart and coronary circulation: Secondary | ICD-10-CM | POA: Insufficient documentation

## 2021-08-25 DIAGNOSIS — I10 Essential (primary) hypertension: Secondary | ICD-10-CM | POA: Insufficient documentation

## 2021-08-25 DIAGNOSIS — R079 Chest pain, unspecified: Secondary | ICD-10-CM | POA: Insufficient documentation

## 2021-08-25 LAB — NM MYOCAR MULTI W/SPECT W/WALL MOTION / EF
Angina Index: 0
Duke Treadmill Score: 7
Estimated workload: 10.1
Exercise duration (min): 7 min
Exercise duration (sec): 24 s
LV dias vol: 105 mL (ref 62–150)
LV sys vol: 40 mL
MPHR: 155 {beats}/min
Nuc Stress EF: 62 %
Peak HR: 142 {beats}/min
Percent HR: 91 %
RATE: 0.4
RPE: 13
Rest HR: 54 {beats}/min
Rest Nuclear Isotope Dose: 9.5 mCi
SDS: 2
SRS: 0
SSS: 2
ST Depression (mm): 0 mm
Stress Nuclear Isotope Dose: 28 mCi
TID: 0.92

## 2021-08-25 MED ORDER — TECHNETIUM TC 99M TETROFOSMIN IV KIT
30.0000 | PACK | Freq: Once | INTRAVENOUS | Status: AC | PRN
  Administered 2021-08-25: 28 via INTRAVENOUS

## 2021-08-25 MED ORDER — TECHNETIUM TC 99M TETROFOSMIN IV KIT
10.0000 | PACK | Freq: Once | INTRAVENOUS | Status: AC | PRN
  Administered 2021-08-25: 9.5 via INTRAVENOUS

## 2021-08-27 ENCOUNTER — Telehealth: Payer: Self-pay | Admitting: *Deleted

## 2021-08-27 MED ORDER — METOPROLOL SUCCINATE ER 25 MG PO TB24: 12.5000 mg | ORAL_TABLET | Freq: Every day | ORAL | 1 refills | Status: DC

## 2021-08-27 NOTE — Telephone Encounter (Signed)
-----   Message from Satira Sark, MD sent at 08/25/2021  7:32 PM EST ----- Results reviewed.  Stress test was overall low risk in terms of exercise data.  Perfusion imaging did show mild regions of potential ischemia in the anterior apical, also lateral apical distribution by my review.  LVEF normal with echocardiogram results pending.  Please schedule a follow-up visit for him, we may need to discuss pursuing a diagnostic cardiac catheterization to better define his coronary anatomy due to his exertional symptoms.  We could try addition of Toprol-XL 12.5 mg daily in the meanwhile in addition to his baseline therapy.

## 2021-08-27 NOTE — Telephone Encounter (Signed)
Patient informed and verbalized understanding of plan. Copy sent to PCP 

## 2021-08-28 ENCOUNTER — Encounter: Payer: Self-pay | Admitting: Cardiology

## 2021-08-31 ENCOUNTER — Ambulatory Visit (INDEPENDENT_AMBULATORY_CARE_PROVIDER_SITE_OTHER): Payer: Medicare Other

## 2021-08-31 ENCOUNTER — Other Ambulatory Visit: Payer: Self-pay

## 2021-08-31 DIAGNOSIS — I10 Essential (primary) hypertension: Secondary | ICD-10-CM

## 2021-08-31 DIAGNOSIS — R079 Chest pain, unspecified: Secondary | ICD-10-CM

## 2021-08-31 LAB — ECHOCARDIOGRAM COMPLETE
Area-P 1/2: 2.45 cm2
S' Lateral: 2.31 cm

## 2021-09-02 ENCOUNTER — Telehealth: Payer: Self-pay | Admitting: *Deleted

## 2021-09-02 ENCOUNTER — Telehealth: Payer: Self-pay | Admitting: Cardiology

## 2021-09-02 ENCOUNTER — Other Ambulatory Visit: Payer: Self-pay | Admitting: Cardiology

## 2021-09-02 ENCOUNTER — Encounter: Payer: Self-pay | Admitting: Cardiology

## 2021-09-02 ENCOUNTER — Ambulatory Visit: Payer: Medicare Other | Admitting: Cardiology

## 2021-09-02 ENCOUNTER — Encounter: Payer: Self-pay | Admitting: *Deleted

## 2021-09-02 VITALS — BP 116/70 | HR 70 | Ht 69.0 in | Wt 232.0 lb

## 2021-09-02 DIAGNOSIS — D6851 Activated protein C resistance: Secondary | ICD-10-CM | POA: Diagnosis not present

## 2021-09-02 DIAGNOSIS — R9439 Abnormal result of other cardiovascular function study: Secondary | ICD-10-CM

## 2021-09-02 DIAGNOSIS — I10 Essential (primary) hypertension: Secondary | ICD-10-CM | POA: Diagnosis not present

## 2021-09-02 DIAGNOSIS — Z9989 Dependence on other enabling machines and devices: Secondary | ICD-10-CM

## 2021-09-02 DIAGNOSIS — G4733 Obstructive sleep apnea (adult) (pediatric): Secondary | ICD-10-CM

## 2021-09-02 MED ORDER — SODIUM CHLORIDE 0.9% FLUSH: 3.0000 mL | Freq: Two times a day (BID) | INTRAVENOUS | Status: DC

## 2021-09-02 NOTE — Telephone Encounter (Signed)
Patient informed and verbalized understanding of plan. Patient will contact PCP to manage lovenox bridging Will fax information to PCP

## 2021-09-02 NOTE — Progress Notes (Signed)
Cardiology Office Note  Date: 09/02/2021   ID: Carl [name], DOB 03/11/1956, MRN [id number]  PCP:  Carl Noble, MD  Cardiologist:  Carl Lesches, MD Electrophysiologist:  None   Chief Complaint  Patient presents with   Cardiac follow-up    History of Present Illness: Carl [name] is a 65 y.o. male seen recently in November for evaluation of chest discomfort and palpitations.  He is here for follow-up with his daughter.  We discussed the results of his cardiac testing.  Recent exercise Myoview revealed no diagnostic ST segment changes, but perfusion imaging consistent with mid to apical anteroseptal ischemia although artifact not excluded.  LVEF by echocardiogram was 70 to 75% without regional wall motion abnormalities and no major valvular abnormalities.  We reviewed the risks and benefits of a diagnostic cardiac catheterization to assess coronary anatomy for revascularization options and further clarify Myoview results.  After discussion he is in agreement to proceed.  We will request recent lab work from Dr. Willey [name] and also coordinate management of his Coumadin through our anticoagulation clinic.  Past Medical History:  Diagnosis Date   Carpal tunnel syndrome    Essential hypertension    Factor 5 Leiden mutation, heterozygous (Carl [name])    History of DVT (deep vein thrombosis) 2020   Left leg   Hyperlipidemia    OSA on CPAP    Type 2 diabetes mellitus (Carl [name])     Past Surgical History:  Procedure Laterality Date   ANKLE SURGERY  1996   Left   CARPAL TUNNEL RELEASE Left 04/08/2015   Procedure: LEFT CARPAL TUNNEL RELEASE;  Surgeon: Daryll Brod, MD;  Location: [place];  Service: Orthopedics;  Laterality: Left;   CARPAL TUNNEL RELEASE Right 05/06/2015   Procedure: RIGHT CARPAL TUNNEL RELEASE;  Surgeon: Daryll Brod, MD;  Location: [place];  Service: Orthopedics;  Laterality: Right;  ANESTHESIA:  IV REGIONAL FAB   COLONOSCOPY  02/2010   Dr.  Oneida Alar: Extremely redundant and tortuous sigmoid and transverse colon.  Normal ileocecal valve, cecum could not be intubated.  Noted to have diverticulosis.  Advised to have follow-up screening colonoscopy in 5 years at [facility] overtube and fluoroscopy.   ESOPHAGOGASTRODUODENOSCOPY N/A 03/09/2018   Fields: Benign-appearing esophageal stricture, small hiatal hernia, mild gastritis with benign biopsies.  No H. pylori.   HERNIA REPAIR     INSERTION OF MESH N/A 04/10/2014   Procedure: INSERTION OF MESH;  Surgeon: Jamesetta So, MD;  [facility];  Service: General;  Laterality: N/A;   SAVORY DILATION N/A 03/09/2018   Procedure: SAVORY DILATION;  Surgeon: Danie Binder, MD;  [facility];  Service: Endoscopy;  Laterality: N/A;   UMBILICAL HERNIA REPAIR N/A 04/10/2014   Procedure: HERNIA REPAIR UMBILICAL ADULT;  Surgeon: Jamesetta So, MD;  Location: [facility];  Service: General;  Laterality: N/A;    Current Outpatient Medications  Medication Sig Dispense Refill   amLODipine (NORVASC) 10 MG tablet Take 10 mg by mouth daily.     Ascorbic Acid (VITAMIN C PO) Take by mouth daily.     Cholecalciferol (VITAMIN D3 PO) Take 50 mcg by mouth daily.     escitalopram (LEXAPRO) 10 MG tablet Take 10 mg by mouth daily.     metoprolol succinate (TOPROL XL) 25 MG 24 hr tablet Take 0.5 tablets (12.5 mg total) by mouth daily. 45 tablet 1   oxymetazoline (AFRIN) 0.05 % nasal spray Place 2 sprays  into both nostrils at bedtime.      pantoprazole (PROTONIX) 40 MG tablet TAKE 1 TABLET BY MOUTH EVERY MORNING 30 MINUTES PRIOR TO FIRST MEAL 90 tablet 3   predniSONE (DELTASONE) 10 MG tablet 12 day tapering dose 48 tablet 0   rosuvastatin (CRESTOR) 10 MG tablet Take 10 mg by mouth at bedtime.     traZODone (DESYREL) 50 MG tablet Take 50 mg by mouth at bedtime as needed.     valsartan (DIOVAN) 160 MG tablet Take 160 mg by mouth daily.     warfarin (COUMADIN) 5 MG tablet Take  2.5-5 mg by mouth at bedtime. Alternates 5mg   one day then 2.5mg  for two days     No current facility-administered medications for this visit.   Allergies:  Amoxicillin   Social History: The patient  reports that he has never smoked. He has never used smokeless tobacco. He reports current alcohol use. He reports that he does not use drugs.   Family History: The patient's family history includes COPD in his father; Clotting disorder in his father; Colon cancer in his nephew; Diabetes in his mother; Hypertension in his father; Rheum arthritis in his sister.   ROS: No syncope.  Physical Exam: VS:  BP 116/70 (BP Location: Left Arm, Patient Position: Sitting, Cuff Size: Large)   Pulse 70   Ht 5\' 9"  (1.753 m)   Wt 232 lb (105.2 kg)   BMI 34.26 kg/m , BMI Body mass index is 34.26 kg/m.  Wt Readings from Last 3 Encounters:  09/02/21 232 lb (105.2 kg)  08/19/21 234 lb 3.2 oz (106.2 kg)  12/02/20 248 lb 6.4 oz (112.7 kg)    General: Patient appears comfortable at rest. HEENT: Conjunctiva and lids normal, wearing a mask. Neck: Supple, no elevated JVP or carotid bruits, no thyromegaly. Lungs: Clear to auscultation, nonlabored breathing at rest. Cardiac: Regular rate and rhythm, no S3 or significant systolic murmur, no pericardial rub. Abdomen: Soft, nontender, no hepatomegaly, bowel sounds present, no guarding or rebound. Extremities: No pitting edema, distal pulses 2+. Skin: Warm and dry. Musculoskeletal: No kyphosis. Neuropsychiatric: Alert and oriented x3, affect grossly appropriate.  ECG:  An ECG dated 08/19/2021 was personally reviewed today and demonstrated:  Sinus rhythm.  Recent Labwork:    Component Value Date/Time   CHOL 181 05/30/2019 0845   TRIG 82 05/30/2019 0845   HDL 45 05/30/2019 0845   CHOLHDL 4.0 05/30/2019 0845   VLDL 16 05/30/2019 0845   LDLCALC 120 (H) 05/30/2019 0845    Other Studies Reviewed Today:  Exercise Myoview 08/25/2021:   The study is low  risk.   Patient exercised according to Bruce protocol for 7:84min achieving 10.1 METs. Target HR was achieved with max HR 142 (91% MPHR)   There were no ST deviations noted during exercise ECG.   LV perfusion is abnormal. There is a small defect with mild reduction in uptake present in the apical to mid anteroseptal location(s) that is reversible consistent with possible ischemia. May also be related to RV insertion point as wall motion appears normal.   Left ventricular function is normal. End diastolic cavity size is normal.  Echocardiogram 08/31/2021:  1. Left ventricular ejection fraction, by estimation, is 70 to 75%. The  left ventricle has hyperdynamic function. The left ventricle has no  regional wall motion abnormalities. There is moderate left ventricular  hypertrophy. Left ventricular diastolic  parameters are indeterminate.   2. Right ventricular systolic function is normal. The right ventricular  size  is normal. Tricuspid regurgitation signal is inadequate for assessing  PA pressure.   3. The mitral valve is grossly normal. Trivial mitral valve  regurgitation.   4. The aortic valve is tricuspid. Aortic valve regurgitation is not  visualized. Aortic valve sclerosis is present, with no evidence of aortic  valve stenosis.   5. The inferior vena cava is normal in size with greater than 50%  respiratory variability, suggesting right atrial pressure of 3 mmHg.   Assessment and Plan:  1.  History of exertional chest tightness and sense of elevated heart rate.  Cardiac risk factors include age and gender, prediabetes, hypertension, and hyperlipidemia.  He also has OSA on CPAP.  Recent ischemic testing was abnormal with Myoview indicating possible mid to apical anteroseptal ischemia although LVEF normal and no significant valvular normalities by echocardiography.  After discussing the risk and benefits of a diagnostic cardiac catheterization he is in agreement to proceed.  Requesting  recent lab work and coordinate transition off Coumadin for procedure through thr anticoagulation clinic.  2.  OSA on CPAP.  3.  Factor V Leiden mutation with history of recurrent left leg DVT.  No history of pulmonary embolus.  He is on chronic Coumadin per Dr. Willey [name].  Medication Adjustments/Labs and Tests Ordered: Current medicines are reviewed at length with the patient today.  Concerns regarding medicines are outlined above.   Tests Ordered: No orders of the defined types were placed in this encounter.   Medication Changes: No orders of the defined types were placed in this encounter.   Disposition:  Follow up  after testing.  Signed, Satira Sark, MD, [facility] 09/02/2021 3:00 PM    [place] at [place], [place], [place] [address] Phone: [phone]; Fax: [phone]

## 2021-09-02 NOTE — Telephone Encounter (Deleted)
-----   Message from Leeroy Bock, [place] sent at 09/02/2021  3:09 PM EST ----- Regarding: RE: DOES HE NEED LOVENOX BRIDGING Yes I'd recommend bridging with Lovenox given his history of Factor V Leiden mutation along with recurrent DVT. Since his PCP manages his warfarin and we can't see his dosing etc in Epic, I'd see if they're able to coordinate his bridge.  Thanks, Jinny Blossom ----- Message ----- From: Merlene Laughter, RN Sent: 09/02/2021   2:45 PM EST To: Cv Div Pharmd Subject: DOES HE NEED LOVENOX BRIDGING                  This patient is having a left heart cath-is on warfarin for Factor 5 Leiden mutation, followed by PCP-[facility] said does he need to be bridged with lovenox?

## 2021-09-02 NOTE — Telephone Encounter (Signed)
PERCERT:  Left heart cath dx: chest pain & abnormal stress test Wednesday, 09/09/21 @11 :30 am with Dr. Ellyn Hack

## 2021-09-02 NOTE — Telephone Encounter (Signed)
-----   Message from Leeroy Bock, [place] sent at 09/02/2021  3:09 PM EST ----- Regarding: RE: DOES HE NEED LOVENOX BRIDGING Yes I'd recommend bridging with Lovenox given his history of Factor V Leiden mutation along with recurrent DVT. Since his PCP manages his warfarin and we can't see his dosing etc in Epic, I'd see if they're able to coordinate his bridge.  Thanks, Jinny Blossom ----- Message ----- From: Merlene Laughter, RN Sent: 09/02/2021   2:45 PM EST To: Cv Div Pharmd Subject: DOES HE NEED LOVENOX BRIDGING                  This patient is having a left heart cath-is on warfarin for Factor 5 Leiden mutation, followed by PCP-[facility] said does he need to be bridged with lovenox?

## 2021-09-02 NOTE — H&P (View-Only) (Signed)
Cardiology Office Note  Date: 09/02/2021   ID: Carl [name], DOB [birth year], MRN [id number]  PCP:  [name], MD  Cardiologist:  Rozann Lesches, MD Electrophysiologist:  None   Chief Complaint  Patient presents with   Cardiac follow-up    History of Present Illness: Carl [name] is a 65 y.o. male seen recently in November for evaluation of chest discomfort and palpitations.  He is here for follow-up with his daughter.  We discussed the results of his cardiac testing.  Recent exercise Myoview revealed no diagnostic ST segment changes, but perfusion imaging consistent with mid to apical anteroseptal ischemia although artifact not excluded.  LVEF by echocardiogram was 70 to 75% without regional wall motion abnormalities and no major valvular abnormalities.  We reviewed the risks and benefits of a diagnostic cardiac catheterization to assess coronary anatomy for revascularization options and further clarify Myoview results.  After discussion he is in agreement to proceed.  We will request recent lab work from Dr. Willey Blade and also coordinate management of his Coumadin through our anticoagulation clinic.  Past Medical History:  Diagnosis Date   Carpal tunnel syndrome    Essential hypertension    Factor 5 Leiden mutation, heterozygous ([place])    History of DVT (deep vein thrombosis) 2020   Left leg   Hyperlipidemia    OSA on CPAP    Type 2 diabetes mellitus ([place])     Past Surgical History:  Procedure Laterality Date   ANKLE SURGERY  1996   Left   CARPAL TUNNEL RELEASE Left 04/08/2015   Procedure: LEFT CARPAL TUNNEL RELEASE;  Surgeon: Daryll Brod, MD;  Location: [facility];  Service: Orthopedics;  Laterality: Left;   CARPAL TUNNEL RELEASE Right 05/06/2015   Procedure: RIGHT CARPAL TUNNEL RELEASE;  Surgeon: Daryll Brod, MD;  Location: [facility];  Service: Orthopedics;  Laterality: Right;  ANESTHESIA:  IV REGIONAL FAB   COLONOSCOPY  02/2010   Dr.  Oneida Alar: Extremely redundant and tortuous sigmoid and transverse colon.  Normal ileocecal valve, cecum could not be intubated.  Noted to have diverticulosis.  Advised to have follow-up screening colonoscopy in 5 years at [facility] overtube and fluoroscopy.   ESOPHAGOGASTRODUODENOSCOPY N/A 03/09/2018   Fields: Benign-appearing esophageal stricture, small hiatal hernia, mild gastritis with benign biopsies.  No H. pylori.   HERNIA REPAIR     INSERTION OF MESH N/A 04/10/2014   Procedure: INSERTION OF MESH;  Surgeon: Jamesetta So, MD;  [facility];  Service: General;  Laterality: N/A;   SAVORY DILATION N/A 03/09/2018   Procedure: SAVORY DILATION;  Surgeon: Danie Binder, MD;  [facility];  Service: Endoscopy;  Laterality: N/A;   UMBILICAL HERNIA REPAIR N/A 04/10/2014   Procedure: HERNIA REPAIR UMBILICAL ADULT;  Surgeon: Jamesetta So, MD;  [facility];  Service: General;  Laterality: N/A;    Current Outpatient Medications  Medication Sig Dispense Refill   amLODipine (NORVASC) 10 MG tablet Take 10 mg by mouth daily.     Ascorbic Acid (VITAMIN C PO) Take by mouth daily.     Cholecalciferol (VITAMIN D3 PO) Take 50 mcg by mouth daily.     escitalopram (LEXAPRO) 10 MG tablet Take 10 mg by mouth daily.     metoprolol succinate (TOPROL XL) 25 MG 24 hr tablet Take 0.5 tablets (12.5 mg total) by mouth daily. 45 tablet 1   oxymetazoline (AFRIN) 0.05 % nasal spray Place 2 sprays  into both nostrils at bedtime.      pantoprazole (PROTONIX) 40 MG tablet TAKE 1 TABLET BY MOUTH EVERY MORNING 30 MINUTES PRIOR TO FIRST MEAL 90 tablet 3   predniSONE (DELTASONE) 10 MG tablet 12 day tapering dose 48 tablet 0   rosuvastatin (CRESTOR) 10 MG tablet Take 10 mg by mouth at bedtime.     traZODone (DESYREL) 50 MG tablet Take 50 mg by mouth at bedtime as needed.     valsartan (DIOVAN) 160 MG tablet Take 160 mg by mouth daily.     warfarin (COUMADIN) 5 MG tablet Take  2.5-5 mg by mouth at bedtime. Alternates 5mg   one day then 2.5mg  for two days     No current facility-administered medications for this visit.   Allergies:  Amoxicillin   Social History: The patient  reports that he has never smoked. He has never used smokeless tobacco. He reports current alcohol use. He reports that he does not use drugs.   Family History: The patient's family history includes COPD in his father; Clotting disorder in his father; Colon cancer in his nephew; Diabetes in his mother; Hypertension in his father; Rheum arthritis in his sister.   ROS: No syncope.  Physical Exam: VS:  BP 116/70 (BP Location: Left Arm, Patient Position: Sitting, Cuff Size: Large)    Pulse 70    Ht 5\' 9"  (1.753 m)    Wt 232 lb (105.2 kg)    BMI 34.26 kg/m , BMI Body mass index is 34.26 kg/m.  Wt Readings from Last 3 Encounters:  09/02/21 232 lb (105.2 kg)  08/19/21 234 lb 3.2 oz (106.2 kg)  12/02/20 248 lb 6.4 oz (112.7 kg)    General: Patient appears comfortable at rest. HEENT: Conjunctiva and lids normal, wearing a mask. Neck: Supple, no elevated JVP or carotid bruits, no thyromegaly. Lungs: Clear to auscultation, nonlabored breathing at rest. Cardiac: Regular rate and rhythm, no S3 or significant systolic murmur, no pericardial rub. Abdomen: Soft, nontender, no hepatomegaly, bowel sounds present, no guarding or rebound. Extremities: No pitting edema, distal pulses 2+. Skin: Warm and dry. Musculoskeletal: No kyphosis. Neuropsychiatric: Alert and oriented x3, affect grossly appropriate.  ECG:  An ECG dated 08/19/2021 was personally reviewed today and demonstrated:  Sinus rhythm.  Recent Labwork:    Component Value Date/Time   CHOL 181 05/30/2019 0845   TRIG 82 05/30/2019 0845   HDL 45 05/30/2019 0845   CHOLHDL 4.0 05/30/2019 0845   VLDL 16 05/30/2019 0845   LDLCALC 120 (H) 05/30/2019 0845    Other Studies Reviewed Today:  Exercise Myoview 08/25/2021:   The study is low  risk.   Patient exercised according to Bruce protocol for 7:15min achieving 10.1 METs. Target HR was achieved with max HR 142 (91% MPHR)   There were no ST deviations noted during exercise ECG.   LV perfusion is abnormal. There is a small defect with mild reduction in uptake present in the apical to mid anteroseptal location(s) that is reversible consistent with possible ischemia. May also be related to RV insertion point as wall motion appears normal.   Left ventricular function is normal. End diastolic cavity size is normal.  Echocardiogram 08/31/2021:  1. Left ventricular ejection fraction, by estimation, is 70 to 75%. The  left ventricle has hyperdynamic function. The left ventricle has no  regional wall motion abnormalities. There is moderate left ventricular  hypertrophy. Left ventricular diastolic  parameters are indeterminate.   2. Right ventricular systolic function is normal. The  right ventricular  size is normal. Tricuspid regurgitation signal is inadequate for assessing  PA pressure.   3. The mitral valve is grossly normal. Trivial mitral valve  regurgitation.   4. The aortic valve is tricuspid. Aortic valve regurgitation is not  visualized. Aortic valve sclerosis is present, with no evidence of aortic  valve stenosis.   5. The inferior vena cava is normal in size with greater than 50%  respiratory variability, suggesting right atrial pressure of 3 mmHg.   Assessment and Plan:  1.  History of exertional chest tightness and sense of elevated heart rate.  Cardiac risk factors include age and gender, prediabetes, hypertension, and hyperlipidemia.  He also has OSA on CPAP.  Recent ischemic testing was abnormal with Myoview indicating possible mid to apical anteroseptal ischemia although LVEF normal and no significant valvular normalities by echocardiography.  After discussing the risk and benefits of a diagnostic cardiac catheterization he is in agreement to proceed.  Requesting  recent lab work and coordinate transition off Coumadin for procedure through thr anticoagulation clinic.  2.  OSA on CPAP.  3.  Factor V Leiden mutation with history of recurrent left leg DVT.  No history of pulmonary embolus.  He is on chronic Coumadin per Dr. Willey Blade.  Medication Adjustments/Labs and Tests Ordered: Current medicines are reviewed at length with the patient today.  Concerns regarding medicines are outlined above.   Tests Ordered: No orders of the defined types were placed in this encounter.   Medication Changes: No orders of the defined types were placed in this encounter.   Disposition:  Follow up  after testing.  Signed, Satira Sark, MD, [facility] 09/02/2021 3:00 PM    [facility] [place], [place], [place] [address] Phone: [phone]; Fax: [phone]

## 2021-09-02 NOTE — Patient Instructions (Signed)
Medication Instructions:  Your physician recommends that you continue on your current medications as directed. Please refer to the Current Medication list given to you today.  Labwork: none  Testing/Procedures: Your physician has requested that you have a cardiac catheterization. Cardiac catheterization is used to diagnose and/or treat various heart conditions. Doctors may recommend this procedure for a number of different reasons. The most common reason is to evaluate chest pain. Chest pain can be a symptom of coronary artery disease (CAD), and cardiac catheterization can show whether plaque is narrowing or blocking your heart's arteries. This procedure is also used to evaluate the valves, as well as measure the blood flow and oxygen levels in different parts of your heart. For further information please visit [email or web address]. Please follow instruction sheet, as given.  Follow-Up: Your physician recommends that you schedule a follow-up appointment in: 1 month  Any Other Special Instructions Will Be Listed Below (If Applicable).  If you need a refill on your cardiac medications before your next appointment, please call your pharmacy.   [address] [place] [address] Dept: [phone] Loc: [phone]  Carl [name] Carl [name]  09/02/2021  You are scheduled for a Cardiac Catheterization on Wednesday, December 14 with Dr. Glenetta Hew.  1. Please arrive at the [facility] (Main Entrance A) at [facility]: [address] [place], [place] [address] at 9:30 AM (This time is two hours before your procedure to ensure your preparation). Free valet parking service is available.   Special note: Every effort is made to have your procedure done on time. Please understand that emergencies sometimes delay scheduled procedures.  2. Diet: Do not eat solid foods after midnight.   The patient may have clear liquids until 5am upon the day of the procedure.  3. Labs: lab work from 08/28/21 is being requested from your family doctor  4. Medication instructions in preparation for your procedure:   Contrast Allergy: No  Stop taking Coumadin (Warfarin) on Friday, December 9. We will contact you about bridging to lovenox once we hear back from the pharmacist  On the morning of your procedure, take your Aspirin 81 mg and any morning medicines NOT listed above.  You may use sips of water.  5. Plan for one night stay--bring personal belongings. 6. Bring a current list of your medications and current insurance cards. 7. You MUST have a responsible person to drive you home. 8. Someone MUST be with you the first 24 hours after you arrive home or your discharge will be delayed. 9. Please wear clothes that are easy to get on and off and wear slip-on shoes.  Thank you for allowing [place] to care for you!   -- [facility]

## 2021-09-03 ENCOUNTER — Telehealth: Payer: Self-pay | Admitting: *Deleted

## 2021-09-03 ENCOUNTER — Other Ambulatory Visit: Payer: Self-pay | Admitting: *Deleted

## 2021-09-03 DIAGNOSIS — R079 Chest pain, unspecified: Secondary | ICD-10-CM

## 2021-09-03 DIAGNOSIS — Z01810 Encounter for preprocedural cardiovascular examination: Secondary | ICD-10-CM

## 2021-09-03 NOTE — Telephone Encounter (Signed)
Advised that recent lab work received from PCP but did not include BMET & CBC. Advised that he can go to [facility] today and have this done. Verbalized undertanding

## 2021-09-04 LAB — CBC
Hematocrit: 43.6 % (ref 37.5–51.0)
Hemoglobin: 14.8 g/dL (ref 13.0–17.7)
MCH: 28.6 pg (ref 26.6–33.0)
MCHC: 33.9 g/dL (ref 31.5–35.7)
MCV: 84 fL (ref 79–97)
Platelets: 272 10*3/uL (ref 150–450)
RBC: 5.18 x10E6/uL (ref 4.14–5.80)
RDW: 13.2 % (ref 11.6–15.4)
WBC: 7.8 10*3/uL (ref 3.4–10.8)

## 2021-09-04 LAB — BASIC METABOLIC PANEL
BUN/Creatinine Ratio: 10 (ref 10–24)
BUN: 7 mg/dL — ABNORMAL LOW (ref 8–27)
CO2: 22 mmol/L (ref 20–29)
Calcium: 9 mg/dL (ref 8.6–10.2)
Chloride: 103 mmol/L (ref 96–106)
Creatinine, Ser: 0.72 mg/dL — ABNORMAL LOW (ref 0.76–1.27)
Glucose: 108 mg/dL — ABNORMAL HIGH (ref 70–99)
Potassium: 3.9 mmol/L (ref 3.5–5.2)
Sodium: 142 mmol/L (ref 134–144)
eGFR: 101 mL/min/{1.73_m2} (ref >59)

## 2021-09-04 NOTE — Telephone Encounter (Signed)
Spoke with patient and he verified that Dr. Willey Blade did order lovenox injections and gave instructions for bridging.

## 2021-09-04 NOTE — Telephone Encounter (Signed)
Spoke with Abigail Butts at Dr. Ria Comment to verify lovenox bridging instructions were given to patient today She will speak with Dr. Willey Blade and call office back with details. Per Abigail Butts, patient was seen at their office today

## 2021-09-07 ENCOUNTER — Telehealth: Payer: Self-pay | Admitting: *Deleted

## 2021-09-07 NOTE — Telephone Encounter (Addendum)
Cardiac catheterization scheduled at [facility] for: Wednesday September 09, 2021 11:30 [place] Entr[facility]) at: 9:30 AM   Diet-no solid food after midnight prior to cath, clear liquids until 5 AM day of procedure.  Medication instructions for procedure: -Hold:  Coumadin/Lovenox bridge per Dr Erick Blinks reports no coumadin starting 09/05/21 with lovenox bridge  Patient reports Lovenox 155 mg daily between 5-7 PM- last dose will be 09/07/21.  -Except hold medications usual morning medications can be taken pre-cath with sips of water including aspirin 81 mg.    Confirmed patient has responsible adult to drive home post procedure and be with patient first 24 hours after arriving home.  [facility] does allow one visitor to accompany you and wait in the hospital waiting room while you are there for your procedure. You and your visitor will be asked to wear a mask once you enter the hospital.   Patient reports does not currently have any new symptoms concerning for COVID-19 and no household members with COVID-19 like illness.             Reviewed procedure/mask/visitor instructions with patient.

## 2021-09-09 ENCOUNTER — Ambulatory Visit (HOSPITAL_COMMUNITY)
Admission: RE | Admit: 2021-09-09 | Discharge: 2021-09-09 | Disposition: A | Discharge status: Home / Self Care | Payer: Medicare Other | Attending: Cardiology | Admitting: Cardiology

## 2021-09-09 ENCOUNTER — Encounter (HOSPITAL_COMMUNITY)
Admission: RE | Disposition: A | Discharge status: Home / Self Care | Payer: Self-pay | Source: Home / Self Care | Attending: Cardiology

## 2021-09-09 DIAGNOSIS — R9439 Abnormal result of other cardiovascular function study: Secondary | ICD-10-CM | POA: Diagnosis not present

## 2021-09-09 DIAGNOSIS — G4733 Obstructive sleep apnea (adult) (pediatric): Secondary | ICD-10-CM | POA: Diagnosis not present

## 2021-09-09 DIAGNOSIS — Z86718 Personal history of other venous thrombosis and embolism: Secondary | ICD-10-CM | POA: Diagnosis not present

## 2021-09-09 DIAGNOSIS — D6851 Activated protein C resistance: Secondary | ICD-10-CM | POA: Insufficient documentation

## 2021-09-09 DIAGNOSIS — I2510 Atherosclerotic heart disease of native coronary artery without angina pectoris: Secondary | ICD-10-CM | POA: Diagnosis not present

## 2021-09-09 DIAGNOSIS — Z7901 Long term (current) use of anticoagulants: Secondary | ICD-10-CM | POA: Diagnosis not present

## 2021-09-09 DIAGNOSIS — R079 Chest pain, unspecified: Secondary | ICD-10-CM | POA: Diagnosis present

## 2021-09-09 HISTORY — PX: INTRAVASCULAR PRESSURE WIRE/FFR STUDY: CATH118243

## 2021-09-09 HISTORY — PX: LEFT HEART CATH AND CORONARY ANGIOGRAPHY: CATH118249

## 2021-09-09 LAB — POCT ACTIVATED CLOTTING TIME: Activated Clotting Time: 245 seconds

## 2021-09-09 LAB — PROTIME-INR
INR: 1.2 (ref 0.8–1.2)
Prothrombin Time: 14.7 seconds (ref 11.4–15.2)

## 2021-09-09 SURGERY — LEFT HEART CATH AND CORONARY ANGIOGRAPHY
Anesthesia: LOCAL

## 2021-09-09 MED ORDER — SODIUM CHLORIDE 0.9 % WEIGHT BASED INFUSION: 1.0000 mL/kg/h | INTRAVENOUS | Status: DC

## 2021-09-09 MED ORDER — IOHEXOL 350 MG/ML SOLN
INTRAVENOUS | Status: DC | PRN
  Administered 2021-09-09: 12:00:00 80 mL

## 2021-09-09 MED ORDER — ONDANSETRON HCL 4 MG/2ML IJ SOLN: 4.0000 mg | Freq: Four times a day (QID) | INTRAMUSCULAR | Status: DC | PRN

## 2021-09-09 MED ORDER — SODIUM CHLORIDE 0.9 % IV SOLN: 250.0000 mL | INTRAVENOUS | Status: DC | PRN

## 2021-09-09 MED ORDER — FENTANYL CITRATE (PF) 100 MCG/2ML IJ SOLN
INTRAMUSCULAR | Status: AC
  Filled 2021-09-09: qty 2

## 2021-09-09 MED ORDER — SODIUM CHLORIDE 0.9% FLUSH: 3.0000 mL | INTRAVENOUS | Status: DC | PRN

## 2021-09-09 MED ORDER — HEPARIN SODIUM (PORCINE) 1000 UNIT/ML IJ SOLN
INTRAMUSCULAR | Status: DC | PRN
  Administered 2021-09-09: 5500 [IU] via INTRAVENOUS
  Administered 2021-09-09: 5000 [IU] via INTRAVENOUS

## 2021-09-09 MED ORDER — HEPARIN SODIUM (PORCINE) 1000 UNIT/ML IJ SOLN
INTRAMUSCULAR | Status: AC
  Filled 2021-09-09: qty 10

## 2021-09-09 MED ORDER — ASPIRIN 81 MG PO CHEW
81.0000 mg | CHEWABLE_TABLET | ORAL | Status: AC
  Administered 2021-09-09: 10:00:00 81 mg via ORAL
  Filled 2021-09-09: qty 1

## 2021-09-09 MED ORDER — HEPARIN (PORCINE) IN NACL 2000-0.9 UNIT/L-% IV SOLN
INTRAVENOUS | Status: DC | PRN
  Administered 2021-09-09: 1000 mL

## 2021-09-09 MED ORDER — LIDOCAINE HCL (PF) 1 % IJ SOLN
INTRAMUSCULAR | Status: DC | PRN
  Administered 2021-09-09: 2 mL via INTRADERMAL

## 2021-09-09 MED ORDER — VERAPAMIL HCL 2.5 MG/ML IV SOLN
INTRAVENOUS | Status: DC | PRN
  Administered 2021-09-09: 12:00:00 10 mL via INTRA_ARTERIAL

## 2021-09-09 MED ORDER — SODIUM CHLORIDE 0.9 % IV SOLN: INTRAVENOUS | Status: DC

## 2021-09-09 MED ORDER — VERAPAMIL HCL 2.5 MG/ML IV SOLN
INTRAVENOUS | Status: AC
  Filled 2021-09-09: qty 2

## 2021-09-09 MED ORDER — ACETAMINOPHEN 325 MG PO TABS: 650.0000 mg | ORAL_TABLET | ORAL | Status: DC | PRN

## 2021-09-09 MED ORDER — MIDAZOLAM HCL 2 MG/2ML IJ SOLN
INTRAMUSCULAR | Status: DC | PRN
  Administered 2021-09-09: 2 mg via INTRAVENOUS

## 2021-09-09 MED ORDER — SODIUM CHLORIDE 0.9 % WEIGHT BASED INFUSION
3.0000 mL/kg/h | INTRAVENOUS | Status: AC
  Administered 2021-09-09: 10:00:00 3 mL/kg/h via INTRAVENOUS

## 2021-09-09 MED ORDER — HEPARIN (PORCINE) IN NACL 2000-0.9 UNIT/L-% IV SOLN
INTRAVENOUS | Status: AC
  Filled 2021-09-09: qty 1000

## 2021-09-09 MED ORDER — FENTANYL CITRATE (PF) 100 MCG/2ML IJ SOLN
INTRAMUSCULAR | Status: DC | PRN
  Administered 2021-09-09: 25 ug via INTRAVENOUS

## 2021-09-09 MED ORDER — MIDAZOLAM HCL 2 MG/2ML IJ SOLN
INTRAMUSCULAR | Status: AC
  Filled 2021-09-09: qty 2

## 2021-09-09 MED ORDER — SODIUM CHLORIDE 0.9% FLUSH: 3.0000 mL | Freq: Two times a day (BID) | INTRAVENOUS | Status: DC

## 2021-09-09 MED ORDER — LABETALOL HCL 5 MG/ML IV SOLN: 10.0000 mg | INTRAVENOUS | Status: DC | PRN

## 2021-09-09 MED ORDER — HYDRALAZINE HCL 20 MG/ML IJ SOLN: 10.0000 mg | INTRAMUSCULAR | Status: DC | PRN

## 2021-09-09 MED ORDER — LIDOCAINE HCL (PF) 1 % IJ SOLN
INTRAMUSCULAR | Status: AC
  Filled 2021-09-09: qty 30

## 2021-09-09 SURGICAL SUPPLY — 12 items
CATH LAUNCHER 6FR JR4 (CATHETERS) ×1 IMPLANT
CATH OPTITORQUE TIG 4.0 5F (CATHETERS) ×1 IMPLANT
DEVICE RAD COMP TR BAND LRG (VASCULAR PRODUCTS) ×1 IMPLANT
GLIDESHEATH SLEND SS 6F .021 (SHEATH) ×1 IMPLANT
GUIDEWIRE INQWIRE 1.5J.035X260 (WIRE) IMPLANT
GUIDEWIRE PRESSURE X 175 (WIRE) ×1 IMPLANT
INQWIRE 1.5J .035X260CM (WIRE) ×2
KIT HEART LEFT (KITS) ×2 IMPLANT
PACK CARDIAC CATHETERIZATION (CUSTOM PROCEDURE TRAY) ×2 IMPLANT
SYR MEDRAD MARK 7 150ML (SYRINGE) ×2 IMPLANT
TRANSDUCER W/STOPCOCK (MISCELLANEOUS) ×2 IMPLANT
TUBING CIL FLEX 10 FLL-RA (TUBING) ×2 IMPLANT

## 2021-09-09 NOTE — Interval H&P Note (Signed)
History and Physical Interval Note:  09/09/2021 11:19 AM  Carl [name]  has presented today for surgery, with the diagnosis of chest pain - abnormal stress test.  The various methods of treatment have been discussed with the patient and family. After consideration of risks, benefits and other options for treatment, the patient has consented to  Procedure(s): LEFT HEART CATH AND CORONARY ANGIOGRAPHY (N/A)  PERCUTANEOUS CORONARY INTERVENTION   as a surgical intervention.  The patient's history has been reviewed, patient examined, no change in status, stable for surgery.  I have reviewed the patient's chart and labs.  Questions were answered to the patient's satisfaction.      Cath Lab Visit (complete for each Cath Lab visit)  Clinical Evaluation Leading to the Procedure:   ACS: No.  Non-ACS:    Anginal Classification: CCS II  Anti-ischemic medical therapy: Minimal Therapy (1 class of medications)  Non-Invasive Test Results: Low-risk stress test findings: cardiac mortality <1%/year - BUT with ongoing Sx & Small Area of possible Anterior Ischemia, referred for invasive evaluation  Prior CABG: No previous CABG   Glenetta Hew

## 2021-09-10 ENCOUNTER — Encounter (HOSPITAL_COMMUNITY): Payer: Self-pay | Admitting: Cardiology

## 2021-09-12 ENCOUNTER — Other Ambulatory Visit: Payer: Self-pay | Admitting: Gastroenterology

## 2021-09-15 NOTE — Telephone Encounter (Signed)
Phoned and spoke with the pt and advised him of the Rx change

## 2021-09-15 NOTE — Telephone Encounter (Signed)
Appears pantoprazole was too expensive. I have sent in omeprazole instead. Take this once each day, 30 minutes before breakfast.

## 2021-09-30 ENCOUNTER — Ambulatory Visit: Payer: Medicare Other | Admitting: Cardiology

## 2021-10-12 ENCOUNTER — Encounter: Payer: Self-pay | Admitting: Cardiology

## 2021-10-12 ENCOUNTER — Ambulatory Visit: Payer: Medicare Other | Admitting: Cardiology

## 2021-10-12 ENCOUNTER — Other Ambulatory Visit: Payer: Self-pay

## 2021-10-12 VITALS — BP 134/68 | HR 64 | Ht 69.0 in | Wt 242.0 lb

## 2021-10-12 DIAGNOSIS — D6851 Activated protein C resistance: Secondary | ICD-10-CM

## 2021-10-12 DIAGNOSIS — I25119 Atherosclerotic heart disease of native coronary artery with unspecified angina pectoris: Secondary | ICD-10-CM | POA: Diagnosis not present

## 2021-10-12 DIAGNOSIS — Z9989 Dependence on other enabling machines and devices: Secondary | ICD-10-CM | POA: Diagnosis not present

## 2021-10-12 DIAGNOSIS — G4733 Obstructive sleep apnea (adult) (pediatric): Secondary | ICD-10-CM | POA: Diagnosis not present

## 2021-10-12 NOTE — Progress Notes (Signed)
Cardiology Office Note  Date: 10/12/2021   ID: Carl [name], DOB [birth year], MRN [id number]  PCP:  [name], MD  Cardiologist:  Rozann Lesches, MD Electrophysiologist:  None   Chief Complaint  Patient presents with   Cardiac follow-up    History of Present Illness: Carl [name] is a 66 y.o. male last seen in December 2022.  He presents following a diagnostic cardiac catheterization performed on December 14 by Dr. Ellyn Hack which was overall reassuring.  He did have moderate atherosclerosis involving the mid RCA that was nonobstructive with RFR 0.97.  He presents for follow-up today, reports no further spells and has been compliant with his medications.  His wife has passed away, he seems to be holding up reasonably well during the grieving process, has support from friends and family.  He is back on Coumadin with follow-up PT/INR through Dr. Ria Comment office.  Past Medical History:  Diagnosis Date   Carpal tunnel syndrome    Essential hypertension    Factor 5 Leiden mutation, heterozygous ([place])    History of DVT (deep vein thrombosis) 2020   Left leg   Hyperlipidemia    OSA on CPAP    Type 2 diabetes mellitus ([place])     Past Surgical History:  Procedure Laterality Date   ANKLE SURGERY  1996   Left   CARPAL TUNNEL RELEASE Left 04/08/2015   Procedure: LEFT CARPAL TUNNEL RELEASE;  Surgeon: Daryll Brod, MD;  Location: [facility];  Service: Orthopedics;  Laterality: Left;   CARPAL TUNNEL RELEASE Right 05/06/2015   Procedure: RIGHT CARPAL TUNNEL RELEASE;  Surgeon: Daryll Brod, MD;  Location: [facility];  Service: Orthopedics;  Laterality: Right;  ANESTHESIA:  IV REGIONAL FAB   COLONOSCOPY  02/2010   Dr. Oneida Alar: Extremely redundant and tortuous sigmoid and transverse colon.  Normal ileocecal valve, cecum could not be intubated.  Noted to have diverticulosis.  Advised to have follow-up screening colonoscopy in 5 years at [facility] overtube and fluoroscopy.   ESOPHAGOGASTRODUODENOSCOPY N/A 03/09/2018   Fields: Benign-appearing esophageal stricture, small hiatal hernia, mild gastritis with benign biopsies.  No H. pylori.   HERNIA REPAIR     INSERTION OF MESH N/A 04/10/2014   Procedure: INSERTION OF MESH;  Surgeon: Jamesetta So, MD;  [facility];  Service: General;  Laterality: N/A;   INTRAVASCULAR PRESSURE WIRE/FFR STUDY N/A 09/09/2021   Procedure: INTRAVASCULAR PRESSURE WIRE/FFR STUDY;  Surgeon: Leonie Man, MD;  Location: [facility];  Service: Cardiovascular;  Laterality: N/A;   LEFT HEART CATH AND CORONARY ANGIOGRAPHY N/A 09/09/2021   Procedure: LEFT HEART CATH AND CORONARY ANGIOGRAPHY;  Surgeon: Leonie Man, MD;  Location: [facility];  Service: Cardiovascular;  Laterality: N/A;   SAVORY DILATION N/A 03/09/2018   Procedure: SAVORY DILATION;  Surgeon: Danie Binder, MD;  [facility];  Service: Endoscopy;  Laterality: N/A;   UMBILICAL HERNIA REPAIR N/A 04/10/2014   Procedure: HERNIA REPAIR UMBILICAL ADULT;  Surgeon: Jamesetta So, MD;  [facility];  Service: General;  Laterality: N/A;    Current Outpatient Medications  Medication Sig Dispense Refill   acetaminophen (TYLENOL) 500 MG tablet Take 500 mg by mouth every 6 (six) hours as needed for moderate pain or headache.     amLODipine (NORVASC) 10 MG tablet Take 10 mg by mouth daily.     Ascorbic Acid (VITAMIN C PO) Take 1 tablet by mouth  daily.     Cholecalciferol (VITAMIN D3 PO) Take 1 capsule by mouth daily.     escitalopram (LEXAPRO) 10 MG tablet Take 10 mg by mouth daily.     metoprolol succinate (TOPROL XL) 25 MG 24 hr tablet Take 0.5 tablets (12.5 mg total) by mouth daily. 45 tablet 1   omeprazole (PRILOSEC) 20 MG capsule Take 1 capsule (20 mg total) by mouth daily. 90 capsule 3   oxymetazoline (AFRIN) 0.05 % nasal spray Place 2 sprays into both nostrils at bedtime.      predniSONE  (DELTASONE) 10 MG tablet 12 day tapering dose 48 tablet 0   rosuvastatin (CRESTOR) 10 MG tablet Take 10 mg by mouth at bedtime.     traZODone (DESYREL) 50 MG tablet Take 50 mg by mouth at bedtime.     valsartan (DIOVAN) 160 MG tablet Take 160 mg by mouth daily.     warfarin (COUMADIN) 5 MG tablet Take 5 mg by mouth at bedtime.     Current Facility-Administered Medications  Medication Dose Route Frequency Provider Last Rate Last Admin   sodium chloride flush (NS) 0.9 % injection 3 mL  3 mL Intravenous Q12H Satira Sark, MD       Allergies:  Amoxicillin and Other   ROS: No palpitations or syncope.  Physical Exam: VS:  BP 134/68    Pulse 64    Ht 5\' 9"  (1.753 m)    Wt 242 lb (109.8 kg)    SpO2 95%    BMI 35.74 kg/m , BMI Body mass index is 35.74 kg/m.  Wt Readings from Last 3 Encounters:  10/12/21 242 lb (109.8 kg)  09/09/21 230 lb (104.3 kg)  09/02/21 232 lb (105.2 kg)    General: Patient appears comfortable at rest. HEENT: Conjunctiva and lids normal, wearing a mask. Neck: Supple, no elevated JVP or carotid bruits, no thyromegaly. Lungs: Clear to auscultation, nonlabored breathing at rest. Cardiac: Regular rate and rhythm, no S3 or significant systolic murmur, no pericardial rub. Extremities: No pitting edema.  ECG:  An ECG dated 09/09/2021 was personally reviewed today and demonstrated:  Sinus bradycardia with prolonged PR interval.  Recent Labwork: 09/03/2021: BUN 7; Creatinine, Ser 0.72; Hemoglobin 14.8; Platelets 272; Potassium 3.9; Sodium 142     Component Value Date/Time   CHOL 181 05/30/2019 0845   TRIG 82 05/30/2019 0845   HDL 45 05/30/2019 0845   CHOLHDL 4.0 05/30/2019 0845   VLDL 16 05/30/2019 0845   LDLCALC 120 (H) 05/30/2019 0845    Other Studies Reviewed Today:  Echocardiogram 08/31/2021:  1. Left ventricular ejection fraction, by estimation, is 70 to 75%. The  left ventricle has hyperdynamic function. The left ventricle has no  regional wall motion  abnormalities. There is moderate left ventricular  hypertrophy. Left ventricular diastolic  parameters are indeterminate.   2. Right ventricular systolic function is normal. The right ventricular  size is normal. Tricuspid regurgitation signal is inadequate for assessing  PA pressure.   3. The mitral valve is grossly normal. Trivial mitral valve  regurgitation.   4. The aortic valve is tricuspid. Aortic valve regurgitation is not  visualized. Aortic valve sclerosis is present, with no evidence of aortic  valve stenosis.   5. The inferior vena cava is normal in size with greater than 50%  respiratory variability, suggesting right atrial pressure of 3 mmHg.   Cardiac catheterization 09/09/2021:   Mid RCA lesion is 55% stenosed.  RFR 0.97   Mid LAD to [facility] LAD  lesion is 40% stenosed.   The left ventricular systolic function is normal.   LV end diastolic pressure is normal.   The left ventricular ejection fraction is 55-65% by visual estimate.   There is no aortic valve stenosis.   SUMMARY Angiographically moderate mid RCA & LAD disease, with no focal lesion to explain symptoms.  Overall large caliber vessels. RFR negative mid RCA segmental lesion (RFR 0.97) Normal LV function with a normal EDP   Assessment and Plan:  1.  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  Plan is to continue medical therapy and observation.  He is not on aspirin given concurrent use of Coumadin.  But otherwise continue Norvasc, Crestor, and Diovan.  2.  Factor V Leiden mutation with history of recurrent left leg DVT on chronic Coumadin.  Keep follow-up PT/INR with Dr. Willey Blade.  3.  OSA on CPAP.  Medication Adjustments/Labs and Tests Ordered: Current medicines are reviewed at length with the patient today.  Concerns regarding medicines are outlined above.   Tests Ordered: No orders of the defined types were placed in this encounter.   Medication Changes: No orders of the defined  types were placed in this encounter.   Disposition:  Follow up  1 year.  Signed, Satira Sark, MD, [facility] 10/12/2021 3:12 PM    [facility] [place], [place], [place] [address] Phone: [phone]; Fax: [phone]

## 2021-10-12 NOTE — Patient Instructions (Signed)
Medication Instructions:  Your physician recommends that you continue on your current medications as directed. Please refer to the Current Medication list given to you today.  *If you need a refill on your cardiac medications before your next appointment, please call your pharmacy*   Lab Work: None If you have labs (blood work) drawn today and your tests are completely normal, you will receive your results only by: [place] [place] (if you have MyChart) OR A paper copy in the mail If you have any lab test that is abnormal or we need to change your treatment, we will call you to review the results.   Testing/Procedures: None   Follow-Up: At [facility], you and your health needs are our priority.  As part of our continuing mission to provide you with exceptional heart care, we have created designated Provider Care Teams.  These Care Teams include your primary Cardiologist (physician) and Advanced Practice Providers (APPs -  Physician Assistants and Nurse Practitioners) who all work together to provide you with the care you need, when you need it.  We recommend signing up for the patient portal called "MyChart".  Sign up information is provided on this After Visit Summary.  MyChart is used to connect with patients for Virtual Visits (Telemedicine).  Patients are able to view lab/test results, encounter notes, upcoming appointments, etc.  Non-urgent messages can be sent to your provider as well.   To learn more about what you can do with MyChart, go to [email or web address].    Your next appointment:   1 year(s)  The format for your next appointment:   In Person  Provider:   Rozann Lesches, MD    Other Instructions

## 2021-10-28 ENCOUNTER — Ambulatory Visit: Payer: Medicare Other | Admitting: Cardiology

## 2022-02-05 ENCOUNTER — Telehealth: Payer: Self-pay | Admitting: Cardiology

## 2022-02-05 NOTE — Telephone Encounter (Signed)
They called to say that got fax info on this patient but he is not insure bout them. Please advise  ?

## 2022-02-17 ENCOUNTER — Other Ambulatory Visit: Payer: Self-pay | Admitting: Cardiology

## 2022-03-03 ENCOUNTER — Telehealth: Payer: Self-pay

## 2022-03-03 NOTE — Telephone Encounter (Signed)
Phoned and LMOVM of the pt to return call. 

## 2022-03-03 NOTE — Telephone Encounter (Signed)
Pt returned the call and I advised him of the note. Pt expressed that the pantoprazole helps his throat. I advised the pt that it was the insurance company who notified us and we are complying. I asked him which one does he want to discontinue he stated he didn't know right at the moment but he would call and let us know tomorrow or stop by and tell us.

## 2022-03-03 NOTE — Telephone Encounter (Signed)
Please call the patient. He is getting pantoprazole from his PCP and omeprazole from [place]. He should only take one or the other. Find out which one he wants to stay on and ask him to discontinue the other. Thanks.

## 2022-03-03 NOTE — Telephone Encounter (Signed)
Documentation from [facility] needing for one of the pt's medications to be discontinued which you had already done on 09/15/2021. He went from Pantoprazole to Omeprazole in his last ov 12/02/2020. This will be placed on your desk.

## 2022-03-04 NOTE — Telephone Encounter (Signed)
Pt decided to let go of the Omeprazole today. He was calling from the parking lot several times trying to get to see [place] but I advised the pt that we were busy with pts and to just let us know which one he wanted to keep. He chose to keep Pantoprazole.

## 2022-03-05 NOTE — Telephone Encounter (Signed)
Noted, he will continue pantoprazole. Please offer patient a follow up ov. We have not seen him since 11/2020.

## 2022-03-08 NOTE — Telephone Encounter (Signed)
Phoned the pt and offered ov but pt stated he did not need an appt. I asked was he low on his medication from here and he replied the didn't know. Advised the pt that front office will be reaching out to him to schedule a appt. Pt replied ok that's fine

## 2022-03-09 ENCOUNTER — Encounter: Payer: Self-pay | Admitting: Internal Medicine

## 2022-04-15 ENCOUNTER — Telehealth: Payer: Self-pay | Admitting: Cardiology

## 2022-04-15 NOTE — Telephone Encounter (Unsigned)
   Pre-operative Risk Assessment    Patient Name: Carl [name]  DOB: [birth year] MRN: [id number]{ 1}      Request for Surgical Clearance{ 1. What type of surgery is being performed? Enter name of procedure below and number of teeth if dental extraction.  :1}    Procedure:   left total knee replacement { 2. When is this surgery scheduled? Press F2 to enter date below and place date in Reason for Visit (see directions below). :1} Date of Surgery:  Clearance 05/28/22                             \  { 3. What is the name of the Surgeon, the Surgeon's Group or Practice, phone and fax number?  Press F2 and list below :1}  Surgeon:  Earlie Server  Surgeon's Group or Practice Name:  [facility] Phone number:  [phone] Fax number:  [phone] { 4. What type of clearance is requested?  Medical or Cardiac Clearance only?  Pharmacy Clearance Only (Request is to hold medication only)?  Or Both?  Press F2 and select the clearance requested.  If both are needed, select both from the drop down list.     :1}  Type of Clearance Requested:   - Pharmacy:  Hold Warfarin (Coumadin)   { 5. What type of anesthesia will be used?  Press F2 and select the anesthesia to be used for the procedure.  :1}  Type of Anesthesia:   choice { 6. Are there any other requests or questions from the surgeon?    :1}  Additional requests/questions:  {Select additional requests/questions or use asterisks to free text (Optional):[id number]}  Signed, Desma Paganini   04/15/2022, 2:30 PM

## 2022-04-16 NOTE — Telephone Encounter (Signed)
Primary Cardiologist:Samuel Domenic Polite, MD   Preoperative team, please contact this patient and set up a phone call appointment for further preoperative risk assessment. Please obtain consent and complete medication review. Thank you for your help.   Guidance regarding holding Warfarin will need to be directed to PCP who manages patient's INR.    Emmaline Life, NP-C    04/16/2022, 12:38 PM [place] [address] Office [phone] Fax [phone]

## 2022-04-19 ENCOUNTER — Telehealth: Payer: Self-pay | Admitting: *Deleted

## 2022-04-19 NOTE — Telephone Encounter (Signed)
Pt agreeable to plan of care for tele pre op appt 05/10/22 @ 3 pm. Med rec and consent are done.    Patient Consent for Virtual Visit        [name] has provided verbal consent on 04/19/2022 for a virtual visit (video or telephone).   CONSENT FOR VIRTUAL VISIT FOR:  Carl [name]  By participating in this virtual visit I agree to the following:  I hereby voluntarily request, consent and authorize [facility] and its employed or contracted physicians, physician assistants, nurse practitioners or other licensed health care professionals (the Practitioner), to provide me with telemedicine health care services (the "Services") as deemed necessary by the treating Practitioner. I acknowledge and consent to receive the Services by the Practitioner via telemedicine. I understand that the telemedicine visit will involve communicating with the Practitioner through live audiovisual communication technology and the disclosure of certain medical information by electronic transmission. I acknowledge that I have been given the opportunity to request an in-person assessment or other available alternative prior to the telemedicine visit and am voluntarily participating in the telemedicine visit.  I understand that I have the right to withhold or withdraw my consent to the use of telemedicine in the course of my care at any time, without affecting my right to future care or treatment, and that the Practitioner or I may terminate the telemedicine visit at any time. I understand that I have the right to inspect all information obtained and/or recorded in the course of the telemedicine visit and may receive copies of available information for a reasonable fee.  I understand that some of the potential risks of receiving the Services via telemedicine include:  Delay or interruption in medical evaluation due to technological equipment failure or disruption; Information transmitted may not be sufficient  (e.g. poor resolution of images) to allow for appropriate medical decision making by the Practitioner; and/or  In rare instances, security protocols could fail, causing a breach of personal health information.  Furthermore, I acknowledge that it is my responsibility to provide information about my medical history, conditions and care that is complete and accurate to the best of my ability. I acknowledge that Practitioner's advice, recommendations, and/or decision may be based on factors not within their control, such as incomplete or inaccurate data provided by me or distortions of diagnostic images or specimens that may result from electronic transmissions. I understand that the practice of medicine is not an exact science and that Practitioner makes no warranties or guarantees regarding treatment outcomes. I acknowledge that a copy of this consent can be made available to me via my patient portal ([facility]), or I can request a printed copy by calling the office of [facility].    I understand that my insurance will be billed for this visit.   I have read or had this consent read to me. I understand the contents of this consent, which adequately explains the benefits and risks of the Services being provided via telemedicine.  I have been provided ample opportunity to ask questions regarding this consent and the Services and have had my questions answered to my satisfaction. I give my informed consent for the services to be provided through the use of telemedicine in my medical care

## 2022-04-19 NOTE — Telephone Encounter (Signed)
Pt agreeable to plan of care for tele pre op appt 05/10/22 @ 3 pm. Med rec and consent are done

## 2022-05-10 ENCOUNTER — Ambulatory Visit (INDEPENDENT_AMBULATORY_CARE_PROVIDER_SITE_OTHER): Payer: Medicare Other | Admitting: Student

## 2022-05-10 DIAGNOSIS — Z01810 Encounter for preprocedural cardiovascular examination: Secondary | ICD-10-CM | POA: Diagnosis not present

## 2022-05-10 NOTE — Progress Notes (Signed)
Virtual Visit via Telephone Note   Because of Carl [name]'s co-morbid illnesses, he is at least at moderate risk for complications without adequate follow up.  This format is felt to be most appropriate for this patient at this time.  The patient did not have access to video technology/had technical difficulties with video requiring transitioning to audio format only (telephone).  All issues noted in this document were discussed and addressed.  No physical exam could be performed with this format.  Please refer to the patient's chart for his consent to telehealth for [facility].  Evaluation Performed:  Preoperative Cardiovascular Risk Assessment _____________   Date:  05/10/2022   Patient ID:  Carl [name], DOB [birth year], MRN [id number] Patient Location:  Home Provider location:   Office  Primary Care Provider:  [name], MD Primary Cardiologist:  Rozann Lesches, MD  Chief Complaint / Patient Profile   Carl [name] is a 66 y.o. year old male with a history of moderate non-obstructive CAD on cardiac catheterization in 08/2021, Factor 5 Leiden with prior DVT on lifelong anticoagulation with Coumadin, hypertension, hyperlipidemia, type 2 diabetes mellitus, who is pending a left total knee replacement and presents today for telephonic preoperative cardiovascular risk assessment.  Past Medical History    Past Medical History:  Diagnosis Date   Carpal tunnel syndrome    Essential hypertension    Factor 5 Leiden mutation, heterozygous ([place])    History of DVT (deep vein thrombosis) 2020   Left leg   Hyperlipidemia    OSA on CPAP    Type 2 diabetes mellitus ([place])    Past Surgical History:  Procedure Laterality Date   ANKLE SURGERY  1996   Left   CARPAL TUNNEL RELEASE Left 04/08/2015   Procedure: LEFT CARPAL TUNNEL RELEASE;  Surgeon: Daryll Brod, MD;  Location: [facility];  Service: Orthopedics;  Laterality: Left;   CARPAL TUNNEL RELEASE Right  05/06/2015   Procedure: RIGHT CARPAL TUNNEL RELEASE;  Surgeon: Daryll Brod, MD;  Location: [facility];  Service: Orthopedics;  Laterality: Right;  ANESTHESIA:  IV REGIONAL FAB   COLONOSCOPY  02/2010   Dr. Oneida Alar: Extremely redundant and tortuous sigmoid and transverse colon.  Normal ileocecal valve, cecum could not be intubated.  Noted to have diverticulosis.  Advised to have follow-up screening colonoscopy in 5 years at [facility] overtube and fluoroscopy.   ESOPHAGOGASTRODUODENOSCOPY N/A 03/09/2018   Fields: Benign-appearing esophageal stricture, small hiatal hernia, mild gastritis with benign biopsies.  No H. pylori.   HERNIA REPAIR     INSERTION OF MESH N/A 04/10/2014   Procedure: INSERTION OF MESH;  Surgeon: Jamesetta So, MD;  Location: [facility];  Service: General;  Laterality: N/A;   INTRAVASCULAR PRESSURE WIRE/FFR STUDY N/A 09/09/2021   Procedure: INTRAVASCULAR PRESSURE WIRE/FFR STUDY;  Surgeon: Leonie Man, MD;  Location: [facility];  Service: Cardiovascular;  Laterality: N/A;   LEFT HEART CATH AND CORONARY ANGIOGRAPHY N/A 09/09/2021   Procedure: LEFT HEART CATH AND CORONARY ANGIOGRAPHY;  Surgeon: Leonie Man, MD;  Location: [facility];  Service: Cardiovascular;  Laterality: N/A;   SAVORY DILATION N/A 03/09/2018   Procedure: SAVORY DILATION;  Surgeon: Danie Binder, MD;  Location[facility];  Service: Endoscopy;  Laterality: N/A;   UMBILICAL HERNIA REPAIR N/A 04/10/2014   Procedure: HERNIA REPAIR UMBILICAL ADULT;  Surgeon: Jamesetta So, MD;  Location: [facility];  Service: General;  Laterality:  N/A;    Allergies  Allergies  Allergen Reactions   Amoxicillin Other (See Comments)    Joints ache and can't move Has patient had a PCN reaction causing immediate rash, facial/tongue/throat swelling, SOB or lightheadedness with hypotension: No Has patient had a PCN reaction causing severe rash involving mucus membranes  or skin necrosis: No Has patient had a PCN reaction that required hospitalization: No Has patient had a PCN reaction occurring within the last 10 years: Yes If all of the above answers are "NO", then may proceed with Cephalosporin use.     Other Diarrhea    Alfredo sauce     History of Present Illness    Carl [name] is a 66 y.o. male who presents via audio/video conferencing for a telehealth visit today.  Patient was last seen in our office on 10/12/2021 by Dr. Domenic Polite.  At that time, he was doing well with from a cardiac standpoint. He is now pending procedure as outlined above. Since his last visit, he has done well. He denies any chest pain, shortness of breath, orthopnea, PND, palpitations, lightheadedness/dizziness, syncope. His activity is limited some by knee pain but he is still easily able to complete >4.0 METS ([place] and still does work like feeding the horses).   Home Medications    Prior to Admission medications   Medication Sig Start Date End Date Taking? Authorizing Provider  acetaminophen (TYLENOL) 500 MG tablet Take 500 mg by mouth every 6 (six) hours as needed for moderate pain or headache.    [provider]  amLODipine (NORVASC) 10 MG tablet Take 10 mg by mouth daily. 12/02/20   [provider]  Ascorbic Acid (VITAMIN C PO) Take 1 tablet by mouth daily.    [provider]  Cholecalciferol (VITAMIN D3 PO) Take 1 capsule by mouth daily.    [provider]  escitalopram (LEXAPRO) 10 MG tablet Take 10 mg by mouth daily. 06/05/20   [provider]  metoprolol succinate (TOPROL-XL) 25 MG 24 hr tablet TAKE 1/2 TABLET BY MOUTH EVERY DAY 02/17/22   Satira Sark, MD  omeprazole (PRILOSEC) 20 MG capsule Take 1 capsule (20 mg total) by mouth daily. 09/15/21   Annitta Needs, NP  oxymetazoline (AFRIN) 0.05 % nasal spray Place 2 sprays into both nostrils at bedtime.     [provider]  predniSONE (DELTASONE) 10  MG tablet 12 day tapering dose 06/05/21   Wallene Huh, DPM  rosuvastatin (CRESTOR) 10 MG tablet Take 10 mg by mouth at bedtime. 05/26/21   [provider]  traZODone (DESYREL) 50 MG tablet Take 50 mg by mouth at bedtime. 05/19/20   [provider]  valsartan (DIOVAN) 160 MG tablet Take 160 mg by mouth daily. 09/22/20   [provider]  warfarin (COUMADIN) 5 MG tablet Take 5 mg by mouth at bedtime. 05/31/20   [provider]    Physical Exam    Vital Signs:  Jaxsun R Cesaro does not have vital signs available for review today.  Given telephonic nature of communication, physical exam is limited. Alert and oriented x3. No acute distress. Normal affect. Speech and respirations are unlabored.  Accessory Clinical Findings    None  Assessment & Plan    Preoperative Cardiovascular Risk Assessment: Patient has an upcoming total knee replacement planned. he is stable from a cardiac standpoint with no angina, acute CHF symptoms, palpitations, syncope. He is able to complete >4.0 METS without any  anginal symptoms. Therefore, based on ACC/AHA guidelines, patient would be at acceptable risk for the planned procedure without further cardiovascular testing. Patient is on Coumadin for history of Factor 5 Leiden and this is managed by her PCP. Therefore, will defer recommendations for holding Coumadin to PCP. I will route this recommendation to the requesting party via Epic fax function.    Time:   Today, I have spent 4 minutes with the patient with telehealth technology discussing medical history, symptoms, and management plan.     Darreld Mclean, PA-C  05/10/2022, 2:58 PM

## 2022-05-13 ENCOUNTER — Ambulatory Visit: Payer: Self-pay | Admitting: Physician Assistant

## 2022-05-13 DIAGNOSIS — G8929 Other chronic pain: Secondary | ICD-10-CM

## 2022-05-13 NOTE — H&P (Signed)
TOTAL KNEE ADMISSION H&P  Patient is being admitted for left total knee arthroplasty.  Subjective:  Chief Complaint:left knee pain.  HPI: Carl [name], 66 y.o. male, has a history of pain and functional disability in the left knee due to arthritis and has failed non-surgical conservative treatments for greater than 12 weeks to includeNSAID's and/or analgesics, corticosteriod injections, weight reduction as appropriate, and activity modification.  Onset of symptoms was gradual, starting 5 years ago with gradually worsening course since that time. The patient noted no past surgery on the left knee(s).  Patient currently rates pain in the left knee(s) at 8 out of 10 with activity. Patient has night pain, worsening of pain with activity and weight bearing, pain that interferes with activities of daily living, pain with passive range of motion, crepitus, and joint swelling.  Patient has evidence of periarticular osteophytes and joint space narrowing by imaging studies. There is no active infection.  Patient Active Problem List   Diagnosis Date Noted   Abnormal myocardial perfusion study 09/09/2021   GERD (gastroesophageal reflux disease) 12/02/2020   NSAID induced gastritis    Esophageal dysphagia 02/06/2018   Screening for colon cancer 02/06/2018   DIABETES MELLITUS 04/22/2010   OSA on CPAP 04/22/2010   Essential hypertension 04/22/2010   Past Medical History:  Diagnosis Date   Carpal tunnel syndrome    Essential hypertension    Factor 5 Leiden mutation, heterozygous ([place])    History of DVT (deep vein thrombosis) 2020   Left leg   Hyperlipidemia    OSA on CPAP    Type 2 diabetes mellitus ([place])     Past Surgical History:  Procedure Laterality Date   ANKLE SURGERY  1996   Left   CARPAL TUNNEL RELEASE Left 04/08/2015   Procedure: LEFT CARPAL TUNNEL RELEASE;  Surgeon: Daryll Brod, MD;  Location: [place];  Service: Orthopedics;  Laterality: Left;   CARPAL TUNNEL  RELEASE Right 05/06/2015   Procedure: RIGHT CARPAL TUNNEL RELEASE;  Surgeon: Daryll Brod, MD;  Location: [place];  Service: Orthopedics;  Laterality: Right;  ANESTHESIA:  IV REGIONAL FAB   COLONOSCOPY  02/2010   Dr. Oneida Alar: Extremely redundant and tortuous sigmoid and transverse colon.  Normal ileocecal valve, cecum could not be intubated.  Noted to have diverticulosis.  Advised to have follow-up screening colonoscopy in 5 years at [facility] overtube and fluoroscopy.   ESOPHAGOGASTRODUODENOSCOPY N/A 03/09/2018   Fields: Benign-appearing esophageal stricture, small hiatal hernia, mild gastritis with benign biopsies.  No H. pylori.   HERNIA REPAIR     INSERTION OF MESH N/A 04/10/2014   Procedure: INSERTION OF MESH;  Surgeon: Jamesetta So, MD;  [facility]: [facility];  Service: General;  Laterality: N/A;   INTRAVASCULAR PRESSURE WIRE/FFR STUDY N/A 09/09/2021   Procedure: INTRAVASCULAR PRESSURE WIRE/FFR STUDY;  Surgeon: Leonie Man, MD;  Location: [place] [facility];  Service: Cardiovascular;  Laterality: N/A;   LEFT HEART CATH AND CORONARY ANGIOGRAPHY N/A 09/09/2021   Procedure: LEFT HEART CATH AND CORONARY ANGIOGRAPHY;  Surgeon: Leonie Man, MD;  Location: [place] [facility];  Service: Cardiovascular;  Laterality: N/A;   SAVORY DILATION N/A 03/09/2018   Procedure: SAVORY DILATION;  Surgeon: Danie Binder, MD;  [facility];  Service: Endoscopy;  Laterality: N/A;   UMBILICAL HERNIA REPAIR N/A 04/10/2014   Procedure: HERNIA REPAIR UMBILICAL ADULT;  Surgeon: Jamesetta So, MD;  Location: [facility];  Service: General;  Laterality: N/A;    Current Outpatient Medications  Medication Sig Dispense Refill Last Dose   acetaminophen (TYLENOL) 500 MG tablet Take 500 mg by mouth every 6 (six) hours as needed for moderate pain or headache.      amLODipine (NORVASC) 10 MG tablet Take 10 mg by mouth daily.      Ascorbic Acid (VITAMIN C PO) Take 1  tablet by mouth daily.      Cholecalciferol (VITAMIN D3 PO) Take 1 capsule by mouth daily.      escitalopram (LEXAPRO) 10 MG tablet Take 10 mg by mouth daily.      metoprolol succinate (TOPROL-XL) 25 MG 24 hr tablet TAKE 1/2 TABLET BY MOUTH EVERY DAY 45 tablet 2    omeprazole (PRILOSEC) 20 MG capsule Take 1 capsule (20 mg total) by mouth daily. 90 capsule 3    oxymetazoline (AFRIN) 0.05 % nasal spray Place 2 sprays into both nostrils at bedtime.       predniSONE (DELTASONE) 10 MG tablet 12 day tapering dose 48 tablet 0    rosuvastatin (CRESTOR) 10 MG tablet Take 10 mg by mouth at bedtime.      traZODone (DESYREL) 50 MG tablet Take 50 mg by mouth at bedtime.      valsartan (DIOVAN) 160 MG tablet Take 160 mg by mouth daily.      warfarin (COUMADIN) 5 MG tablet Take 5 mg by mouth at bedtime.      Current Facility-Administered Medications  Medication Dose Route Frequency Provider Last Rate Last Admin   sodium chloride flush (NS) 0.9 % injection 3 mL  3 mL Intravenous Q12H Satira Sark, MD       Allergies  Allergen Reactions   Amoxicillin Other (See Comments)    Joints ache and can't move Has patient had a PCN reaction causing immediate rash, facial/tongue/throat swelling, SOB or lightheadedness with hypotension: No Has patient had a PCN reaction causing severe rash involving mucus membranes or skin necrosis: No Has patient had a PCN reaction that required hospitalization: No Has patient had a PCN reaction occurring within the last 10 years: Yes If all of the above answers are "NO", then may proceed with Cephalosporin use.     Other Diarrhea    Alfredo sauce     Social History   Tobacco Use   Smoking status: Never   Smokeless tobacco: Never  Substance Use Topics   Alcohol use: Yes    Comment: Occasional    Family History  Problem Relation Age of Onset   Diabetes Mother    Hypertension Father    Clotting disorder Father    COPD Father    Rheum arthritis Sister    Colon  cancer Nephew        29   Colon polyps Neg Hx      Review of Systems  Cardiovascular:  Positive for chest pain.  Musculoskeletal:  Positive for arthralgias.  All other systems reviewed and are negative.   Objective:  Physical Exam Constitutional:      General: He is not in acute distress.    Appearance: Normal appearance.  HENT:     Head: Normocephalic and atraumatic.  Eyes:     Extraocular Movements: Extraocular movements intact.     Pupils: Pupils are equal, round, and reactive to light.  Cardiovascular:     Rate and Rhythm: Normal rate and regular rhythm.     Pulses: Normal pulses.     Heart sounds: Normal heart sounds.  Pulmonary:  Effort: Pulmonary effort is normal. No respiratory distress.     Breath sounds: Normal breath sounds. No wheezing.  Abdominal:     General: Abdomen is flat. Bowel sounds are normal. There is no distension.     Palpations: Abdomen is soft.     Tenderness: There is no abdominal tenderness.  Musculoskeletal:     Cervical back: Normal range of motion and neck supple.     Left knee: Swelling and bony tenderness present. No erythema. Normal range of motion. Tenderness present.  Lymphadenopathy:     Cervical: No cervical adenopathy.  Skin:    General: Skin is warm and dry.     Findings: No erythema or rash.  Neurological:     General: No focal deficit present.     Mental Status: He is alert and oriented to person, place, and time.  Psychiatric:        Mood and Affect: Mood normal.        Behavior: Behavior normal.     Vital signs in last 24 hours: '@VSRANGES'$ @  Labs:   Estimated body mass index is 35.74 kg/m as calculated from the following:   Height as of 10/12/21: '5\' 9"'$  (1.753 m).   Weight as of 10/12/21: 109.8 kg.   Imaging Review Plain radiographs demonstrate moderate degenerative joint disease of the left knee(s). The overall alignment ismild varus. The bone quality appears to be good for age and reported activity  level.      Assessment/Plan:  End stage arthritis, left knee   The patient history, physical examination, clinical judgment of the provider and imaging studies are consistent with end stage degenerative joint disease of the left knee(s) and total knee arthroplasty is deemed medically necessary. The treatment options including medical management, injection therapy arthroscopy and arthroplasty were discussed at length. The risks and benefits of total knee arthroplasty were presented and reviewed. The risks due to aseptic loosening, infection, stiffness, patella tracking problems, thromboembolic complications and other imponderables were discussed. The patient acknowledged the explanation, agreed to proceed with the plan and consent was signed. Patient is being admitted for inpatient treatment for surgery, pain control, PT, OT, prophylactic antibiotics, VTE prophylaxis, progressive ambulation and ADL's and discharge planning. The patient is planning to be discharged  [facility].     Patient's anticipated LOS is less than 2 midnights, meeting these requirements: - Younger than 73 - Lives within 1 hour of care - Has a competent adult at home to recover with post-op recover - NO history of  - Chronic pain requiring opiods  - Diabetes  - Coronary Artery Disease  - Heart failure  - Heart attack  - Stroke  - DVT/VTE  - Cardiac arrhythmia  - Respiratory Failure/COPD  - Renal failure  - Anemia  - Advanced Liver disease

## 2022-05-13 NOTE — H&P (View-Only) (Signed)
TOTAL KNEE ADMISSION H&P  Patient is being admitted for left total knee arthroplasty.  Subjective:  Chief Complaint:left knee pain.  HPI: Carl [name], 66 y.o. male, has a history of pain and functional disability in the left knee due to arthritis and has failed non-surgical conservative treatments for greater than 12 weeks to includeNSAID's and/or analgesics, corticosteriod injections, weight reduction as appropriate, and activity modification.  Onset of symptoms was gradual, starting 5 years ago with gradually worsening course since that time. The patient noted no past surgery on the left knee(s).  Patient currently rates pain in the left knee(s) at 8 out of 10 with activity. Patient has night pain, worsening of pain with activity and weight bearing, pain that interferes with activities of daily living, pain with passive range of motion, crepitus, and joint swelling.  Patient has evidence of periarticular osteophytes and joint space narrowing by imaging studies. There is no active infection.  Patient Active Problem List   Diagnosis Date Noted   Abnormal myocardial perfusion study 09/09/2021   GERD (gastroesophageal reflux disease) 12/02/2020   NSAID induced gastritis    Esophageal dysphagia 02/06/2018   Screening for colon cancer 02/06/2018   DIABETES MELLITUS 04/22/2010   OSA on CPAP 04/22/2010   Essential hypertension 04/22/2010   Past Medical History:  Diagnosis Date   Carpal tunnel syndrome    Essential hypertension    Factor 5 Leiden mutation, heterozygous ([place])    History of DVT (deep vein thrombosis) 2020   Left leg   Hyperlipidemia    OSA on CPAP    Type 2 diabetes mellitus ([place])     Past Surgical History:  Procedure Laterality Date   ANKLE SURGERY  1996   Left   CARPAL TUNNEL RELEASE Left 04/08/2015   Procedure: LEFT CARPAL TUNNEL RELEASE;  Surgeon: Daryll Brod, MD;  Location: [place];  Service: Orthopedics;  Laterality: Left;   CARPAL TUNNEL  RELEASE Right 05/06/2015   Procedure: RIGHT CARPAL TUNNEL RELEASE;  Surgeon: Daryll Brod, MD;  Location: [place];  Service: Orthopedics;  Laterality: Right;  ANESTHESIA:  IV REGIONAL FAB   COLONOSCOPY  02/2010   Dr. Oneida Alar: Extremely redundant and tortuous sigmoid and transverse colon.  Normal ileocecal valve, cecum could not be intubated.  Noted to have diverticulosis.  Advised to have follow-up screening colonoscopy in 5 years at [facility] overtube and fluoroscopy.   ESOPHAGOGASTRODUODENOSCOPY N/A 03/09/2018   Fields: Benign-appearing esophageal stricture, small hiatal hernia, mild gastritis with benign biopsies.  No H. pylori.   HERNIA REPAIR     INSERTION OF MESH N/A 04/10/2014   Procedure: INSERTION OF MESH;  Surgeon: Jamesetta So, MD;  Location: [facility];  Service: General;  Laterality: N/A;   INTRAVASCULAR PRESSURE WIRE/FFR STUDY N/A 09/09/2021   Procedure: INTRAVASCULAR PRESSURE WIRE/FFR STUDY;  Surgeon: Leonie Man, MD;  Location: [place] [facility];  Service: Cardiovascular;  Laterality: N/A;   LEFT HEART CATH AND CORONARY ANGIOGRAPHY N/A 09/09/2021   Procedure: LEFT HEART CATH AND CORONARY ANGIOGRAPHY;  Surgeon: Leonie Man, MD;  Location: [place] [facility];  Service: Cardiovascular;  Laterality: N/A;   SAVORY DILATION N/A 03/09/2018   Procedure: SAVORY DILATION;  Surgeon: Danie Binder, MD;  [facility];  Service: Endoscopy;  Laterality: N/A;   UMBILICAL HERNIA REPAIR N/A 04/10/2014   Procedure: HERNIA REPAIR UMBILICAL ADULT;  Surgeon: Jamesetta So, MD;  Location: [facility];  Service: General;  Laterality: N/A;    Current Outpatient Medications  Medication Sig Dispense Refill Last Dose   acetaminophen (TYLENOL) 500 MG tablet Take 500 mg by mouth every 6 (six) hours as needed for moderate pain or headache.      amLODipine (NORVASC) 10 MG tablet Take 10 mg by mouth daily.      Ascorbic Acid (VITAMIN C PO) Take 1  tablet by mouth daily.      Cholecalciferol (VITAMIN D3 PO) Take 1 capsule by mouth daily.      escitalopram (LEXAPRO) 10 MG tablet Take 10 mg by mouth daily.      metoprolol succinate (TOPROL-XL) 25 MG 24 hr tablet TAKE 1/2 TABLET BY MOUTH EVERY DAY 45 tablet 2    omeprazole (PRILOSEC) 20 MG capsule Take 1 capsule (20 mg total) by mouth daily. 90 capsule 3    oxymetazoline (AFRIN) 0.05 % nasal spray Place 2 sprays into both nostrils at bedtime.       predniSONE (DELTASONE) 10 MG tablet 12 day tapering dose 48 tablet 0    rosuvastatin (CRESTOR) 10 MG tablet Take 10 mg by mouth at bedtime.      traZODone (DESYREL) 50 MG tablet Take 50 mg by mouth at bedtime.      valsartan (DIOVAN) 160 MG tablet Take 160 mg by mouth daily.      warfarin (COUMADIN) 5 MG tablet Take 5 mg by mouth at bedtime.      Current Facility-Administered Medications  Medication Dose Route Frequency Provider Last Rate Last Admin   sodium chloride flush (NS) 0.9 % injection 3 mL  3 mL Intravenous Q12H Satira Sark, MD       Allergies  Allergen Reactions   Amoxicillin Other (See Comments)    Joints ache and can't move Has patient had a PCN reaction causing immediate rash, facial/tongue/throat swelling, SOB or lightheadedness with hypotension: No Has patient had a PCN reaction causing severe rash involving mucus membranes or skin necrosis: No Has patient had a PCN reaction that required hospitalization: No Has patient had a PCN reaction occurring within the last 10 years: Yes If all of the above answers are "NO", then may proceed with Cephalosporin use.     Other Diarrhea    Alfredo sauce     Social History   Tobacco Use   Smoking status: Never   Smokeless tobacco: Never  Substance Use Topics   Alcohol use: Yes    Comment: Occasional    Family History  Problem Relation Age of Onset   Diabetes Mother    Hypertension Father    Clotting disorder Father    COPD Father    Rheum arthritis Sister    Colon  cancer Nephew        60   Colon polyps Neg Hx      Review of Systems  Cardiovascular:  Positive for chest pain.  Musculoskeletal:  Positive for arthralgias.  All other systems reviewed and are negative.   Objective:  Physical Exam Constitutional:      General: He is not in acute distress.    Appearance: Normal appearance.  HENT:     Head: Normocephalic and atraumatic.  Eyes:     Extraocular Movements: Extraocular movements intact.     Pupils: Pupils are equal, round, and reactive to light.  Cardiovascular:     Rate and Rhythm: Normal rate and regular rhythm.     Pulses: Normal pulses.     Heart sounds: Normal heart sounds.  Pulmonary:  Effort: Pulmonary effort is normal. No respiratory distress.     Breath sounds: Normal breath sounds. No wheezing.  Abdominal:     General: Abdomen is flat. Bowel sounds are normal. There is no distension.     Palpations: Abdomen is soft.     Tenderness: There is no abdominal tenderness.  Musculoskeletal:     Cervical back: Normal range of motion and neck supple.     Left knee: Swelling and bony tenderness present. No erythema. Normal range of motion. Tenderness present.  Lymphadenopathy:     Cervical: No cervical adenopathy.  Skin:    General: Skin is warm and dry.     Findings: No erythema or rash.  Neurological:     General: No focal deficit present.     Mental Status: He is alert and oriented to person, place, and time.  Psychiatric:        Mood and Affect: Mood normal.        Behavior: Behavior normal.     Vital signs in last 24 hours: '@VSRANGES'$ @  Labs:   Estimated body mass index is 35.74 kg/m as calculated from the following:   Height as of 10/12/21: '5\' 9"'$  (1.753 m).   Weight as of 10/12/21: 109.8 kg.   Imaging Review Plain radiographs demonstrate moderate degenerative joint disease of the left knee(s). The overall alignment ismild varus. The bone quality appears to be good for age and reported activity  level.      Assessment/Plan:  End stage arthritis, left knee   The patient history, physical examination, clinical judgment of the provider and imaging studies are consistent with end stage degenerative joint disease of the left knee(s) and total knee arthroplasty is deemed medically necessary. The treatment options including medical management, injection therapy arthroscopy and arthroplasty were discussed at length. The risks and benefits of total knee arthroplasty were presented and reviewed. The risks due to aseptic loosening, infection, stiffness, patella tracking problems, thromboembolic complications and other imponderables were discussed. The patient acknowledged the explanation, agreed to proceed with the plan and consent was signed. Patient is being admitted for inpatient treatment for surgery, pain control, PT, OT, prophylactic antibiotics, VTE prophylaxis, progressive ambulation and ADL's and discharge planning. The patient is planning to be discharged  [place] [facility].     Patient's anticipated LOS is less than 2 midnights, meeting these requirements: - Younger than 44 - Lives within 1 hour of care - Has a competent adult at home to recover with post-op recover - NO history of  - Chronic pain requiring opiods  - Diabetes  - Coronary Artery Disease  - Heart failure  - Heart attack  - Stroke  - DVT/VTE  - Cardiac arrhythmia  - Respiratory Failure/COPD  - Renal failure  - Anemia  - Advanced Liver disease

## 2022-05-19 NOTE — Progress Notes (Addendum)
COVID Vaccine received:  '[]'$  No '[x]'$  Yes Date of any COVID positive Test in last 90 days: none  PCP - [name], MD  Cardiologist - Rozann Lesches, MD     Cardiac clearance- Sande Rives, PA-C  05-10-22 note in [facility].  Chest x-ray - n/a EKG - 09-10-2021  Epic   Stress Test - 08-25-2021 Epic ECHO - 08-31-2021 Epic Cardiac Cath - 09-09-2021 Epic  (pressure wire/ FFR study)   by Dr. Glenetta Hew  Pacemaker/ICD device     '[x]'$  N/A Spinal Cord Stimulator:'[x]'$  No '[]'$  Yes   Other Implants:   History of Sleep Apnea? '[]'$  No '[x]'$  Yes   Sleep Study Date:   CPAP used?- '[]'$  No '[x]'$  Yes  (Instruct to bring their mask & Tubing)  Does the patient monitor blood sugar? '[]'$  No '[x]'$  Yes   Pre-DM  diet controlled  Fasting Blood Sugar Ranges-  Checks Blood Sugar __0___ times a day  Blood Thinner Instructions:  Coumadin   Hold 5 days with a Lovenox bridge; to be arranged by Dr. Willey Blade.  Aspirin Instructions:none Last Dose:  ERAS Protocol Ordered: '[]'$  No  '[x]'$  Yes PRE-SURGERY '[]'$  ENSURE  '[x]'$  G2   Comments: VANCOMYCIN ordered  Activity level: Patient can not climb a flight of stairs without difficulty;  '[x]'$  No CP  '[x]'$  No SOB,  but would have __knee pain   Anesthesia review: OSA (CPAP), Hx DVT, Factor 5 leiden, HTN, Pre-DM (diet)  Patient denies shortness of breath, fever, cough and chest pain at PAT appointment.  Patient verbalized understanding and agreement to the Pre-Surgical Instructions that were given to them at this PAT appointment. Patient was also educated of the need to review these PAT instructions again prior to his/her surgery.I reviewed the appropriate phone numbers to call if they have any and questions or concerns.

## 2022-05-19 NOTE — Patient Instructions (Addendum)
DUE TO SPACE LIMITATIONS, ONLY TWO VISITORS  (aged 66 and older) ARE ALLOWED TO COME WITH YOU AND STAY IN THE WAITING ROOM DURING YOUR PRE OP AND PROCEDURE.   **NO VISITORS ARE ALLOWED IN THE SHORT STAY AREA OR RECOVERY ROOM!!**  You are not required to quarantine at this time prior to your surgery. However, you must do this: Hand Hygiene often Do NOT share personal items Notify your provider if you are in close contact with someone who has COVID or you develop fever 100.4 or greater, new onset of sneezing, cough, sore throat, shortness of breath or body aches.       Your procedure is scheduled on:  Friday May 28, 2022  Report to [facility] Main Entrance.  Report to admitting at:  05:15   AM  +++++Call this number if you have any questions or problems the morning of surgery [phone]  Do not eat food :After Midnight the night prior to your surgery/procedure.  After Midnight you may have the following liquids until  04:30  AM DAY OF SURGERY  Clear Liquid Diet Water Black Coffee (sugar ok, NO MILK/CREAM OR CREAMERS)  Tea (sugar ok, NO MILK/CREAM OR CREAMERS) regular and decaf                             Plain Jell-O (NO RED)                                           Fruit ices (not with fruit pulp, NO RED)                                     Popsicles (NO RED)                                                                  Juice: apple, WHITE grape, WHITE cranberry Sports drinks like Gatorade (NO RED)                    The day of surgery:  Drink ONE (1) Pre-Surgery G2 at  04:30 AM the morning of surgery. Drink in one sitting. Do not sip.  This drink was given to you during your hospital pre-op appointment visit. Nothing else to drink after completing the Pre-Surgery  G2.    FOLLOW ANY ADDITIONAL PRE OP INSTRUCTIONS YOU RECEIVED FROM YOUR SURGEON'S OFFICE!!!   Oral Hygiene is also important to reduce your risk of infection.        Remember - BRUSH YOUR  TEETH THE MORNING OF SURGERY WITH YOUR REGULAR TOOTHPASTE   Take ONLY these medicines the morning of surgery with A SIP OF WATER: Metoprolol, amlodipine, pantoprazole (Protonix); you may take Tylenol if needed.    Bring CPAP mask and tubing day of surgery.                   You may not have any metal on your body including jewelry, and body piercing  Do not wear lotions, powders, cologne, or deodorant  Men may shave face and neck.  DO NOT [place]. PHARMACY WILL DISPENSE MEDICATIONS LISTED ON YOUR MEDICATION LIST TO YOU DURING YOUR ADMISSION [place]!   Patients discharged on the day of surgery will not be allowed to drive home.  Someone NEEDS to stay with you for the first 24 hours after anesthesia.  Special Instructions: Bring a copy of your healthcare power of attorney and living will documents the day of surgery, if you wish to have them scanned into your [place] Medical Records- EPIC  Please read over the following fact sheets you were given: IF YOU HAVE QUESTIONS ABOUT YOUR PRE-OP INSTRUCTIONS, PLEASE CALL [phone]  ([place])   [place] - Preparing for Surgery Before surgery, you can play an important role.  Because skin is not sterile, your skin needs to be as free of germs as possible.  You can reduce the number of germs on your skin by washing with CHG (chlorahexidine gluconate) soap before surgery.  CHG is an antiseptic cleaner which kills germs and bonds with the skin to continue killing germs even after washing. Please DO NOT use if you have an allergy to CHG or antibacterial soaps.  If your skin becomes reddened/irritated stop using the CHG and inform your nurse when you arrive at Short Stay. Do not shave (including legs and underarms) for at least 48 hours prior to the first CHG shower.  You may shave your face/neck.  Please follow these instructions carefully:  1.  Shower with CHG Soap the night before surgery and the   morning of surgery.  2.  If you choose to wash your hair, wash your hair first as usual with your normal  shampoo.  3.  After you shampoo, rinse your hair and body thoroughly to remove the shampoo.                             4.  Use CHG as you would any other liquid soap.  You can apply chg directly to the skin and wash.  Gently with a scrungie or clean washcloth.  5.  Apply the CHG Soap to your body ONLY FROM THE NECK DOWN.   Do not use on face/ open                           Wound or open sores. Avoid contact with eyes, ears mouth and genitals (private parts).                       Wash face,  Genitals (private parts) with your normal soap.             6.  Wash thoroughly, paying special attention to the area where your  surgery  will be performed.  7.  Thoroughly rinse your body with warm water from the neck down.  8.  DO NOT shower/wash with your normal soap after using and rinsing off the CHG Soap.            9.  Pat yourself dry with a clean towel.            10.  Wear clean pajamas.            11.  Place clean sheets on your bed the night of your first shower and do not  sleep with pets.  ON THE DAY  OF SURGERY : Do not apply any lotions/deodorants the morning of surgery.  Please wear clean clothes to the hospital/surgery center.    FAILURE TO FOLLOW THESE INSTRUCTIONS MAY RESULT IN THE CANCELLATION OF YOUR SURGERY  PATIENT SIGNATURE_________________________________  NURSE SIGNATURE__________________________________  ________________________________________________________________________     Carl [name]    An incentive spirometer is a tool that can help keep your lungs clear and active. This tool measures how well you are filling your lungs with each breath. Taking long deep breaths may help reverse or decrease the chance of developing breathing (pulmonary) problems (especially infection) following: A long period of time when you are unable to move or be  active. BEFORE THE PROCEDURE  If the spirometer includes an indicator to show your best effort, your nurse or respiratory therapist will set it to a desired goal. If possible, sit up straight or lean slightly forward. Try not to slouch. Hold the incentive spirometer in an upright position. INSTRUCTIONS FOR USE  Sit on the edge of your bed if possible, or sit up as far as you can in bed or on a chair. Hold the incentive spirometer in an upright position. Breathe out normally. Place the mouthpiece in your mouth and seal your lips tightly around it. Breathe in slowly and as deeply as possible, raising the piston or the ball toward the top of the column. Hold your breath for 3-5 seconds or for as long as possible. Allow the piston or ball to fall to the bottom of the column. Remove the mouthpiece from your mouth and breathe out normally. Rest for a few seconds and repeat Steps 1 through 7 at least 10 times every 1-2 hours when you are awake. Take your time and take a few normal breaths between deep breaths. The spirometer may include an indicator to show your best effort. Use the indicator as a goal to work toward during each repetition. After each set of 10 deep breaths, practice coughing to be sure your lungs are clear. If you have an incision (the cut made at the time of surgery), support your incision when coughing by placing a pillow or rolled up towels firmly against it. Once you are able to get out of bed, walk around indoors and cough well. You may stop using the incentive spirometer when instructed by your caregiver.  RISKS AND COMPLICATIONS Take your time so you do not get dizzy or light-headed. If you are in pain, you may need to take or ask for pain medication before doing incentive spirometry. It is harder to take a deep breath if you are having pain. AFTER USE Rest and breathe slowly and easily. It can be helpful to keep track of a log of your progress. Your caregiver can provide you  with a simple table to help with this. If you are using the spirometer at home, follow these instructions: [place] IF:  You are having difficultly using the spirometer. You have trouble using the spirometer as often as instructed. Your pain medication is not giving enough relief while using the spirometer. You develop fever of 100.5 F (38.1 C) or higher.  SEEK IMMEDIATE MEDICAL CARE IF:  You cough up bloody sputum that had not been present before. You develop fever of 102 F (38.9 C) or greater. You develop worsening pain at or near the incision site. MAKE SURE YOU:  Understand these instructions. Will watch your condition. Will get help right away if you are not doing well or get worse. Document Released: 01/24/2007 Document Revised: 12/06/2011 Document Reviewed: 03/27/2007 [facility] Patient Information 2014 [place], [place].

## 2022-05-21 ENCOUNTER — Other Ambulatory Visit: Payer: Self-pay

## 2022-05-21 ENCOUNTER — Encounter (HOSPITAL_COMMUNITY): Payer: Self-pay

## 2022-05-21 ENCOUNTER — Encounter (HOSPITAL_COMMUNITY)
Admission: RE | Admit: 2022-05-21 | Discharge: 2022-05-21 | Disposition: A | Discharge status: Home / Self Care | Payer: Medicare Other | Source: Ambulatory Visit | Attending: Orthopedic Surgery | Admitting: Orthopedic Surgery

## 2022-05-21 VITALS — BP 139/93 | HR 66 | Temp 98.7°F | Resp 24 | Ht 69.0 in | Wt 256.0 lb

## 2022-05-21 DIAGNOSIS — G8929 Other chronic pain: Secondary | ICD-10-CM

## 2022-05-21 DIAGNOSIS — M25562 Pain in left knee: Secondary | ICD-10-CM | POA: Insufficient documentation

## 2022-05-21 DIAGNOSIS — Z01818 Encounter for other preprocedural examination: Secondary | ICD-10-CM | POA: Insufficient documentation

## 2022-05-21 DIAGNOSIS — E119 Type 2 diabetes mellitus without complications: Secondary | ICD-10-CM | POA: Insufficient documentation

## 2022-05-21 HISTORY — DX: Personal history of urinary calculi: Z87.442

## 2022-05-21 HISTORY — DX: Unspecified osteoarthritis, unspecified site: M19.90

## 2022-05-21 HISTORY — DX: Malignant (primary) neoplasm, unspecified: C80.1

## 2022-05-21 HISTORY — DX: Gastro-esophageal reflux disease without esophagitis: K21.9

## 2022-05-21 HISTORY — DX: Prediabetes: R73.03

## 2022-05-21 LAB — CBC WITH DIFFERENTIAL/PLATELET
Abs Immature Granulocytes: 0.05 10*3/uL (ref 0.00–0.07)
Basophils Absolute: 0 10*3/uL (ref 0.0–0.1)
Basophils Relative: 1 %
Eosinophils Absolute: 0.1 10*3/uL (ref 0.0–0.5)
Eosinophils Relative: 1 %
HCT: 44.4 % (ref 39.0–52.0)
Hemoglobin: 14.5 g/dL (ref 13.0–17.0)
Immature Granulocytes: 1 %
Lymphocytes Relative: 23 %
Lymphs Abs: 1.9 10*3/uL (ref 0.7–4.0)
MCH: 28.1 pg (ref 26.0–34.0)
MCHC: 32.7 g/dL (ref 30.0–36.0)
MCV: 86 fL (ref 80.0–100.0)
Monocytes Absolute: 1.1 10*3/uL — ABNORMAL HIGH (ref 0.1–1.0)
Monocytes Relative: 13 %
Neutro Abs: 5.3 10*3/uL (ref 1.7–7.7)
Neutrophils Relative %: 61 %
Platelets: 235 10*3/uL (ref 150–400)
RBC: 5.16 MIL/uL (ref 4.22–5.81)
RDW: 12.8 % (ref 11.5–15.5)
WBC: 8.5 10*3/uL (ref 4.0–10.5)
nRBC: 0 % (ref 0.0–0.2)

## 2022-05-21 LAB — COMPREHENSIVE METABOLIC PANEL
ALT: 33 U/L (ref 0–44)
AST: 19 U/L (ref 15–41)
Albumin: 4.1 g/dL (ref 3.5–5.0)
Alkaline Phosphatase: 58 U/L (ref 38–126)
Anion gap: 10 (ref 5–15)
BUN: 11 mg/dL (ref 8–23)
CO2: 25 mmol/L (ref 22–32)
Calcium: 8.9 mg/dL (ref 8.9–10.3)
Chloride: 104 mmol/L (ref 98–111)
Creatinine, Ser: 0.66 mg/dL (ref 0.61–1.24)
GFR, Estimated: >60 mL/min (ref >60)
Glucose, Bld: 143 mg/dL — ABNORMAL HIGH (ref 70–99)
Potassium: 3.7 mmol/L (ref 3.5–5.1)
Sodium: 139 mmol/L (ref 135–145)
Total Bilirubin: 0.8 mg/dL (ref 0.3–1.2)
Total Protein: 7.2 g/dL (ref 6.5–8.1)

## 2022-05-21 LAB — HEMOGLOBIN A1C
Hgb A1c MFr Bld: 6.9 % — ABNORMAL HIGH (ref 4.8–5.6)
Mean Plasma Glucose: 151.33 mg/dL

## 2022-05-21 LAB — SURGICAL PCR SCREEN
MRSA, PCR: NEGATIVE
Staphylococcus aureus: POSITIVE — AB

## 2022-05-21 LAB — GLUCOSE, CAPILLARY: Glucose-Capillary: 151 mg/dL — ABNORMAL HIGH (ref 70–99)

## 2022-05-24 NOTE — Progress Notes (Signed)
Patient's PCR screen is positive for STAPH. Appropriate notes have been placed on the patient's chart. This note has been routed to Dr. French Ana for review. The Patient's surgery is currently Scheduled for:  Friday May 28, 2022    Leota Jacobsen, BSN, CVRN-BC   Pre-Surgical Testing Nurse [place]  [phone]

## 2022-05-24 NOTE — Progress Notes (Signed)
Anesthesia Chart Review   Case: [id number] Date/Time: 05/28/22 0715   Procedure: TOTAL KNEE ARTHROPLASTY (Left: Knee)   Anesthesia type: Choice   Pre-op diagnosis: OA LEFT KNEE   Location: [facility] ROOM 07 / [facility]   Surgeons: Earlie Server, MD       DISCUSSION:66 y.o. never smoker with h/o HTN, OSA on CPAP, Factor 5 Leiden, DVT, DM II, left knee OA scheduled for above procedure 05/28/2022 with Dr. Earlie Server.   Pt seen by cardiology 05/10/2022 for preoperative evaluation.  Per OV note, "Patient has an upcoming total knee replacement planned. he is stable from a cardiac standpoint with no angina, acute CHF symptoms, palpitations, syncope. He is able to complete >4.0 METS without any anginal symptoms. Therefore, based on ACC/AHA guidelines, patient would be at acceptable risk for the planned procedure without further cardiovascular testing. Patient is on Coumadin for history of Factor 5 Leiden and this is managed by her PCP. Therefore, will defer recommendations for holding Coumadin to PCP. I will route this recommendation to the requesting party via [place] fax function. "  On Coumadin, advised to hold 5 days with Lovenox bridge.   Anticipate pt can proceed with planned procedure barring acute status change.   VS: BP (!) 139/93 Comment: right arm sitting  Pulse 66   Temp 37.1 C (Oral)   Resp (!) 24   Ht '5\' 9"'$  (1.753 m)   Wt 116.1 kg   SpO2 98%   BMI 37.80 kg/m   PROVIDERS: [name], MD is PCP   Cardiologist - Rozann Lesches, MD LABS: Labs reviewed: Acceptable for surgery. (all labs ordered are listed, but only abnormal results are displayed)  Labs Reviewed  SURGICAL PCR SCREEN - Abnormal; Notable for the following components:      Result Value   Staphylococcus aureus POSITIVE (*)    All other components within normal limits  CBC WITH DIFFERENTIAL/PLATELET - Abnormal; Notable for the following components:   Monocytes Absolute 1.1 (*)    All other components within normal limits   COMPREHENSIVE METABOLIC PANEL - Abnormal; Notable for the following components:   Glucose, Bld 143 (*)    All other components within normal limits  HEMOGLOBIN A1C - Abnormal; Notable for the following components:   Hgb A1c MFr Bld 6.9 (*)    All other components within normal limits  GLUCOSE, CAPILLARY - Abnormal; Notable for the following components:   Glucose-Capillary 151 (*)    All other components within normal limits  TYPE AND SCREEN     IMAGES:   EKG:   CV: Cardiac Cath 09/09/2021     Mid RCA lesion is 55% stenosed.  RFR 0.97   Mid LAD to Dist LAD lesion is 40% stenosed.   The left ventricular systolic function is normal.   LV end diastolic pressure is normal.   The left ventricular ejection fraction is 55-65% by visual estimate.   There is no aortic valve stenosis.   SUMMARY Angiographically moderate mid RCA & LAD disease, with no focal lesion to explain symptoms.  Overall large caliber vessels. RFR negative mid RCA segmental lesion (RFR 0.97) Normal LV function with a normal EDP      RECOMMENDATIONS Consider other nonischemic etiologies for concerning symptoms. Discharged home today after bedrest somewhat delayed because of x-ray have administered.  Echo 08/31/2021 1. Left ventricular ejection fraction, by estimation, is 70 to 75%. The  left ventricle has hyperdynamic function. The left ventricle has no  regional wall motion abnormalities. There  is moderate left ventricular  hypertrophy. Left ventricular diastolic  parameters are indeterminate.   2. Right ventricular systolic function is normal. The right ventricular  size is normal. Tricuspid regurgitation signal is inadequate for assessing  PA pressure.   3. The mitral valve is grossly normal. Trivial mitral valve  regurgitation.   4. The aortic valve is tricuspid. Aortic valve regurgitation is not  visualized. Aortic valve sclerosis is present, with no evidence of aortic  valve stenosis.   5. The  inferior vena cava is normal in size with greater than 50%  respiratory variability, suggesting right atrial pressure of 3 mmHg. Past Medical History:  Diagnosis Date   Arthritis    Cancer ([place])    skin cancer   Carpal tunnel syndrome    Essential hypertension    Factor 5 Leiden mutation, heterozygous ([place])    GERD (gastroesophageal reflux disease)    History of DVT (deep vein thrombosis) 2020   Left leg   History of kidney stones    Hyperlipidemia    OSA on CPAP    Pre-diabetes     Past Surgical History:  Procedure Laterality Date   ANKLE SURGERY  1996   Left   CARPAL TUNNEL RELEASE Left 04/08/2015   Procedure: LEFT CARPAL TUNNEL RELEASE;  Surgeon: Daryll Brod, MD;  Location: [place];  Service: Orthopedics;  Laterality: Left;   CARPAL TUNNEL RELEASE Right 05/06/2015   Procedure: RIGHT CARPAL TUNNEL RELEASE;  Surgeon: Daryll Brod, MD;  Location: [facility];  Service: Orthopedics;  Laterality: Right;  ANESTHESIA:  IV REGIONAL FAB   COLONOSCOPY  02/2010   Dr. Oneida Alar: Extremely redundant and tortuous sigmoid and transverse colon.  Normal ileocecal valve, cecum could not be intubated.  Noted to have diverticulosis.  Advised to have follow-up screening colonoscopy in 5 years at [facility] overtube and fluoroscopy.   ESOPHAGOGASTRODUODENOSCOPY N/A 03/09/2018   Fields: Benign-appearing esophageal stricture, small hiatal hernia, mild gastritis with benign biopsies.  No H. pylori.   HERNIA REPAIR     INSERTION OF MESH N/A 04/10/2014   Procedure: INSERTION OF MESH;  Surgeon: Jamesetta So, MD;  Location: [facility];  Service: General;  Laterality: N/A;   INTRAVASCULAR PRESSURE WIRE/FFR STUDY N/A 09/09/2021   Procedure: INTRAVASCULAR PRESSURE WIRE/FFR STUDY;  Surgeon: Leonie Man, MD;  Location: [place] CV LAB;  Service: Cardiovascular;  Laterality: N/A;   LEFT HEART CATH AND CORONARY ANGIOGRAPHY N/A 09/09/2021   Procedure:  LEFT HEART CATH AND CORONARY ANGIOGRAPHY;  Surgeon: Leonie Man, MD;  Location: [place] CV LAB;  Service: Cardiovascular;  Laterality: N/A;   SAVORY DILATION N/A 03/09/2018   Procedure: SAVORY DILATION;  Surgeon: Danie Binder, MD;  [facility];  Service: Endoscopy;  Laterality: N/A;   UMBILICAL HERNIA REPAIR N/A 04/10/2014   Procedure: HERNIA REPAIR UMBILICAL ADULT;  Surgeon: Jamesetta So, MD;  Location: [facility];  Service: General;  Laterality: N/A;    MEDICATIONS:  acetaminophen (TYLENOL) 500 MG tablet   amLODipine (NORVASC) 10 MG tablet   Ascorbic Acid (VITAMIN C) 1000 MG tablet   Cholecalciferol (VITAMIN D) 50 MCG (2000 UT) tablet   enoxaparin (LOVENOX) 150 MG/ML injection   escitalopram (LEXAPRO) 10 MG tablet   metoprolol succinate (TOPROL-XL) 25 MG 24 hr tablet   omeprazole (PRILOSEC) 20 MG capsule   oxymetazoline (AFRIN) 0.05 % nasal spray   pantoprazole (PROTONIX) 40 MG tablet   rosuvastatin (CRESTOR) 10 MG  tablet   traZODone (DESYREL) 50 MG tablet   valsartan (DIOVAN) 160 MG tablet   warfarin (COUMADIN) 5 MG tablet    sodium chloride flush (NS) 0.9 % injection 3 mL    [facility], PA-C [facility] Pre-Surgical Testing [phone]

## 2022-05-27 ENCOUNTER — Ambulatory Visit: Payer: Self-pay | Admitting: Physician Assistant

## 2022-05-27 DIAGNOSIS — Z7901 Long term (current) use of anticoagulants: Secondary | ICD-10-CM

## 2022-05-27 NOTE — Anesthesia Preprocedure Evaluation (Addendum)
Anesthesia Evaluation  Patient identified by MRN, date of birth, ID band Patient awake    Reviewed: Allergy & Precautions, NPO status , Patient's Chart, lab work & pertinent test results, reviewed documented beta blocker date and time   Airway Mallampati: III  TM Distance: >3 FB Neck ROM: Full    Dental  (+) Teeth Intact, Dental Advisory Given   Pulmonary sleep apnea and Continuous Positive Airway Pressure Ventilation ,    Pulmonary exam normal breath sounds clear to auscultation       Cardiovascular hypertension, Pt. on home beta blockers and Pt. on medications + DVT  Normal cardiovascular exam Rhythm:Regular Rate:Normal     Neuro/Psych negative neurological ROS  negative psych ROS   GI/Hepatic Neg liver ROS, GERD  Medicated,  Endo/Other  diabetes, Type 2Obesity   Renal/GU negative Renal ROS     Musculoskeletal  (+) Arthritis ,   Abdominal   Peds  Hematology  (+) Blood dyscrasia (Coumadin; Factor 5 Leiden mutation), , Plt 235k   Anesthesia Other Findings   Reproductive/Obstetrics                            Anesthesia Physical Anesthesia Plan  ASA: 3  Anesthesia Plan: General   Post-op Pain Management: Tylenol PO (pre-op)* and Regional block*   Induction: Intravenous  PONV Risk Score and Plan: 2 and Midazolam, TIVA, Dexamethasone and Ondansetron  Airway Management Planned: Oral ETT  Additional Equipment:   Intra-op Plan:   Post-operative Plan: Extubation in OR  Informed Consent: I have reviewed the patients History and Physical, chart, labs and discussed the procedure including the risks, benefits and alternatives for the proposed anesthesia with the patient or authorized representative who has indicated his/her understanding and acceptance.     Dental advisory given  Plan Discussed with: CRNA  Anesthesia Plan Comments:        Anesthesia Quick Evaluation

## 2022-05-27 NOTE — Discharge Instructions (Signed)

## 2022-05-28 ENCOUNTER — Ambulatory Visit (HOSPITAL_COMMUNITY): Payer: Medicare Other | Admitting: Physician Assistant

## 2022-05-28 ENCOUNTER — Ambulatory Visit (HOSPITAL_BASED_OUTPATIENT_CLINIC_OR_DEPARTMENT_OTHER): Payer: Medicare Other | Admitting: Anesthesiology

## 2022-05-28 ENCOUNTER — Encounter (HOSPITAL_COMMUNITY): Payer: Self-pay | Admitting: Orthopedic Surgery

## 2022-05-28 ENCOUNTER — Other Ambulatory Visit: Payer: Self-pay

## 2022-05-28 ENCOUNTER — Ambulatory Visit (HOSPITAL_COMMUNITY)
Admission: RE | Admit: 2022-05-28 | Discharge: 2022-05-28 | Disposition: A | Discharge status: Home / Self Care | Payer: Medicare Other | Attending: Orthopedic Surgery | Admitting: Orthopedic Surgery

## 2022-05-28 ENCOUNTER — Encounter (HOSPITAL_COMMUNITY)
Admission: RE | Disposition: A | Discharge status: Home / Self Care | Payer: Self-pay | Source: Home / Self Care | Attending: Orthopedic Surgery

## 2022-05-28 DIAGNOSIS — Z7901 Long term (current) use of anticoagulants: Secondary | ICD-10-CM | POA: Insufficient documentation

## 2022-05-28 DIAGNOSIS — R7303 Prediabetes: Secondary | ICD-10-CM | POA: Diagnosis not present

## 2022-05-28 DIAGNOSIS — E669 Obesity, unspecified: Secondary | ICD-10-CM | POA: Diagnosis not present

## 2022-05-28 DIAGNOSIS — M1712 Unilateral primary osteoarthritis, left knee: Secondary | ICD-10-CM | POA: Diagnosis not present

## 2022-05-28 DIAGNOSIS — Z86718 Personal history of other venous thrombosis and embolism: Secondary | ICD-10-CM | POA: Insufficient documentation

## 2022-05-28 DIAGNOSIS — M21162 Varus deformity, not elsewhere classified, left knee: Secondary | ICD-10-CM | POA: Insufficient documentation

## 2022-05-28 DIAGNOSIS — M24562 Contracture, left knee: Secondary | ICD-10-CM | POA: Insufficient documentation

## 2022-05-28 DIAGNOSIS — Z6837 Body mass index (BMI) 37.0-37.9, adult: Secondary | ICD-10-CM | POA: Diagnosis not present

## 2022-05-28 DIAGNOSIS — E119 Type 2 diabetes mellitus without complications: Secondary | ICD-10-CM

## 2022-05-28 DIAGNOSIS — Z79899 Other long term (current) drug therapy: Secondary | ICD-10-CM | POA: Insufficient documentation

## 2022-05-28 DIAGNOSIS — B958 Unspecified staphylococcus as the cause of diseases classified elsewhere: Secondary | ICD-10-CM | POA: Diagnosis not present

## 2022-05-28 DIAGNOSIS — K219 Gastro-esophageal reflux disease without esophagitis: Secondary | ICD-10-CM | POA: Insufficient documentation

## 2022-05-28 DIAGNOSIS — I10 Essential (primary) hypertension: Secondary | ICD-10-CM

## 2022-05-28 DIAGNOSIS — D6851 Activated protein C resistance: Secondary | ICD-10-CM | POA: Insufficient documentation

## 2022-05-28 DIAGNOSIS — G4733 Obstructive sleep apnea (adult) (pediatric): Secondary | ICD-10-CM | POA: Insufficient documentation

## 2022-05-28 HISTORY — PX: TOTAL KNEE ARTHROPLASTY: SHX125

## 2022-05-28 LAB — ABO/RH: ABO/RH(D): A POS

## 2022-05-28 LAB — PROTIME-INR
INR: 1 (ref 0.8–1.2)
Prothrombin Time: 13.5 seconds (ref 11.4–15.2)

## 2022-05-28 LAB — TYPE AND SCREEN
ABO/RH(D): A POS
Antibody Screen: NEGATIVE

## 2022-05-28 SURGERY — ARTHROPLASTY, KNEE, TOTAL
Anesthesia: General | Site: Knee | Laterality: Left

## 2022-05-28 MED ORDER — SUGAMMADEX SODIUM 500 MG/5ML IV SOLN
INTRAVENOUS | Status: DC | PRN
  Administered 2022-05-28: 200 mg via INTRAVENOUS

## 2022-05-28 MED ORDER — PROPOFOL 10 MG/ML IV BOLUS
INTRAVENOUS | Status: AC
  Filled 2022-05-28: qty 20

## 2022-05-28 MED ORDER — SODIUM CHLORIDE 0.9% FLUSH
INTRAVENOUS | Status: DC | PRN
  Administered 2022-05-28: 50 mL

## 2022-05-28 MED ORDER — TRANEXAMIC ACID 1000 MG/10ML IV SOLN
2000.0000 mg | INTRAVENOUS | Status: AC
  Administered 2022-05-28: 1000 mg via TOPICAL
  Filled 2022-05-28: qty 20

## 2022-05-28 MED ORDER — ORAL CARE MOUTH RINSE: 15.0000 mL | Freq: Once | OROMUCOSAL | Status: AC

## 2022-05-28 MED ORDER — DEXAMETHASONE SODIUM PHOSPHATE 10 MG/ML IJ SOLN
INTRAMUSCULAR | Status: DC | PRN
  Administered 2022-05-28: 8 mg via INTRAVENOUS

## 2022-05-28 MED ORDER — BUPIVACAINE-EPINEPHRINE (PF) 0.25% -1:200000 IJ SOLN
INTRAMUSCULAR | Status: AC
  Filled 2022-05-28: qty 30

## 2022-05-28 MED ORDER — WATER FOR IRRIGATION, STERILE IR SOLN
Status: DC | PRN
  Administered 2022-05-28: 2000 mL

## 2022-05-28 MED ORDER — DEXMEDETOMIDINE HCL IN NACL 80 MCG/20ML IV SOLN
INTRAVENOUS | Status: AC
Start: 2022-05-28 — End: ?
  Filled 2022-05-28: qty 20

## 2022-05-28 MED ORDER — FENTANYL CITRATE (PF) 100 MCG/2ML IJ SOLN
INTRAMUSCULAR | Status: DC | PRN
Start: 2022-05-28 — End: 2022-05-28
  Administered 2022-05-28 (×5): 50 ug via INTRAVENOUS

## 2022-05-28 MED ORDER — LACTATED RINGERS IV SOLN: INTRAVENOUS | Status: DC

## 2022-05-28 MED ORDER — ROPIVACAINE HCL 5 MG/ML IJ SOLN
INTRAMUSCULAR | Status: DC | PRN
  Administered 2022-05-28: 20 mL via PERINEURAL

## 2022-05-28 MED ORDER — MIDAZOLAM HCL 5 MG/5ML IJ SOLN
INTRAMUSCULAR | Status: DC | PRN
  Administered 2022-05-28: 2 mg via INTRAVENOUS

## 2022-05-28 MED ORDER — ACETAMINOPHEN 500 MG PO TABS
1000.0000 mg | ORAL_TABLET | Freq: Once | ORAL | Status: AC
  Administered 2022-05-28: 1000 mg via ORAL
  Filled 2022-05-28: qty 2

## 2022-05-28 MED ORDER — PROPOFOL 10 MG/ML IV BOLUS
INTRAVENOUS | Status: DC | PRN
  Administered 2022-05-28: 150 mg via INTRAVENOUS

## 2022-05-28 MED ORDER — FENTANYL CITRATE (PF) 100 MCG/2ML IJ SOLN
INTRAMUSCULAR | Status: AC
  Filled 2022-05-28: qty 2

## 2022-05-28 MED ORDER — LACTATED RINGERS IV BOLUS
250.0000 mL | Freq: Once | INTRAVENOUS | Status: AC
Start: 2022-05-28 — End: 2022-05-28
  Administered 2022-05-28: 250 mL via INTRAVENOUS

## 2022-05-28 MED ORDER — BUPIVACAINE LIPOSOME 1.3 % IJ SUSP
INTRAMUSCULAR | Status: DC | PRN
  Administered 2022-05-28: 20 mL

## 2022-05-28 MED ORDER — ONDANSETRON HCL 4 MG/2ML IJ SOLN
INTRAMUSCULAR | Status: DC | PRN
  Administered 2022-05-28: 4 mg via INTRAVENOUS

## 2022-05-28 MED ORDER — ONDANSETRON HCL 4 MG/2ML IJ SOLN
INTRAMUSCULAR | Status: AC
Start: 2022-05-28 — End: ?
  Filled 2022-05-28: qty 2

## 2022-05-28 MED ORDER — PROPOFOL 500 MG/50ML IV EMUL
INTRAVENOUS | Status: DC | PRN
  Administered 2022-05-28: 100 ug/kg/min via INTRAVENOUS

## 2022-05-28 MED ORDER — LIDOCAINE HCL (CARDIAC) PF 100 MG/5ML IV SOSY
PREFILLED_SYRINGE | INTRAVENOUS | Status: DC | PRN
  Administered 2022-05-28: 80 mg via INTRAVENOUS

## 2022-05-28 MED ORDER — CHLORHEXIDINE GLUCONATE 0.12 % MT SOLN
15.0000 mL | Freq: Once | OROMUCOSAL | Status: AC
  Administered 2022-05-28: 15 mL via OROMUCOSAL

## 2022-05-28 MED ORDER — LIDOCAINE HCL (PF) 2 % IJ SOLN
INTRAMUSCULAR | Status: AC
  Filled 2022-05-28: qty 5

## 2022-05-28 MED ORDER — VANCOMYCIN HCL IN DEXTROSE 1-5 GM/200ML-% IV SOLN: 1000.0000 mg | Freq: Two times a day (BID) | INTRAVENOUS | Status: DC

## 2022-05-28 MED ORDER — FENTANYL CITRATE PF 50 MCG/ML IJ SOSY
25.0000 ug | PREFILLED_SYRINGE | INTRAMUSCULAR | Status: DC | PRN
  Administered 2022-05-28 (×2): 50 ug via INTRAVENOUS

## 2022-05-28 MED ORDER — TRANEXAMIC ACID 1000 MG/10ML IV SOLN
INTRAVENOUS | Status: DC | PRN
  Administered 2022-05-28: 2000 mg via TOPICAL

## 2022-05-28 MED ORDER — ROCURONIUM BROMIDE 10 MG/ML (PF) SYRINGE
PREFILLED_SYRINGE | INTRAVENOUS | Status: AC
  Filled 2022-05-28: qty 10

## 2022-05-28 MED ORDER — POVIDONE-IODINE 10 % EX SWAB
2.0000 | Freq: Once | CUTANEOUS | Status: AC
  Administered 2022-05-28: 2 via TOPICAL

## 2022-05-28 MED ORDER — FENTANYL CITRATE PF 50 MCG/ML IJ SOSY
PREFILLED_SYRINGE | INTRAMUSCULAR | Status: AC
  Filled 2022-05-28: qty 1

## 2022-05-28 MED ORDER — OXYCODONE HCL 5 MG PO TABS
ORAL_TABLET | ORAL | Status: AC
  Filled 2022-05-28: qty 1

## 2022-05-28 MED ORDER — LACTATED RINGERS IV BOLUS
500.0000 mL | Freq: Once | INTRAVENOUS | Status: AC
Start: 2022-05-28 — End: 2022-05-28
  Administered 2022-05-28: 500 mL via INTRAVENOUS

## 2022-05-28 MED ORDER — BUPIVACAINE LIPOSOME 1.3 % IJ SUSP
INTRAMUSCULAR | Status: AC
  Filled 2022-05-28: qty 20

## 2022-05-28 MED ORDER — TRANEXAMIC ACID-NACL 1000-0.7 MG/100ML-% IV SOLN
1000.0000 mg | INTRAVENOUS | Status: DC
  Filled 2022-05-28: qty 100

## 2022-05-28 MED ORDER — PHENYLEPHRINE HCL (PRESSORS) 10 MG/ML IV SOLN
INTRAVENOUS | Status: AC
  Filled 2022-05-28: qty 1

## 2022-05-28 MED ORDER — DEXAMETHASONE SODIUM PHOSPHATE 10 MG/ML IJ SOLN
INTRAMUSCULAR | Status: AC
  Filled 2022-05-28: qty 1

## 2022-05-28 MED ORDER — MIDAZOLAM HCL 2 MG/2ML IJ SOLN
INTRAMUSCULAR | Status: AC
  Filled 2022-05-28: qty 2

## 2022-05-28 MED ORDER — SODIUM CHLORIDE (PF) 0.9 % IJ SOLN
INTRAMUSCULAR | Status: AC
  Filled 2022-05-28: qty 50

## 2022-05-28 MED ORDER — SODIUM CHLORIDE 0.9 % IV SOLN
INTRAVENOUS | Status: DC | PRN
  Administered 2022-05-28: 1000 mL

## 2022-05-28 MED ORDER — ROCURONIUM BROMIDE 100 MG/10ML IV SOLN
INTRAVENOUS | Status: DC | PRN
  Administered 2022-05-28: 60 mg via INTRAVENOUS

## 2022-05-28 MED ORDER — TRANEXAMIC ACID-NACL 1000-0.7 MG/100ML-% IV SOLN
INTRAVENOUS | Status: AC
  Filled 2022-05-28: qty 100

## 2022-05-28 MED ORDER — PROMETHAZINE HCL 25 MG/ML IJ SOLN: 6.2500 mg | INTRAMUSCULAR | Status: DC | PRN

## 2022-05-28 MED ORDER — 0.9 % SODIUM CHLORIDE (POUR BTL) OPTIME
TOPICAL | Status: DC | PRN
  Administered 2022-05-28: 1000 mL

## 2022-05-28 MED ORDER — OXYCODONE HCL 5 MG PO TABS
5.0000 mg | ORAL_TABLET | Freq: Once | ORAL | Status: AC
  Administered 2022-05-28: 5 mg via ORAL

## 2022-05-28 MED ORDER — SODIUM CHLORIDE 0.9 % IV SOLN: INTRAVENOUS | Status: DC

## 2022-05-28 MED ORDER — BUPIVACAINE LIPOSOME 1.3 % IJ SUSP: 20.0000 mL | Freq: Once | INTRAMUSCULAR | Status: DC

## 2022-05-28 MED ORDER — DEXMEDETOMIDINE (PRECEDEX) IN NS 20 MCG/5ML (4 MCG/ML) IV SYRINGE
PREFILLED_SYRINGE | INTRAVENOUS | Status: DC | PRN
  Administered 2022-05-28: 12 ug via INTRAVENOUS

## 2022-05-28 MED ORDER — BUPIVACAINE-EPINEPHRINE (PF) 0.25% -1:200000 IJ SOLN
INTRAMUSCULAR | Status: DC | PRN
  Administered 2022-05-28: 30 mL

## 2022-05-28 MED ORDER — TRANEXAMIC ACID-NACL 1000-0.7 MG/100ML-% IV SOLN
1000.0000 mg | Freq: Once | INTRAVENOUS | Status: AC
  Administered 2022-05-28: 1000 mg via INTRAVENOUS

## 2022-05-28 MED ORDER — VANCOMYCIN HCL IN DEXTROSE 1-5 GM/200ML-% IV SOLN
1000.0000 mg | INTRAVENOUS | Status: AC
  Administered 2022-05-28: 1000 mg via INTRAVENOUS
  Filled 2022-05-28: qty 200

## 2022-05-28 SURGICAL SUPPLY — 62 items
ATTUNE MED DOME PAT 41 KNEE (Knees) IMPLANT
ATTUNE PS FEM LT SZ 6 CEM KNEE (Femur) IMPLANT
ATTUNE PSRP INSR SZ6 5 KNEE (Insert) IMPLANT
BAG COUNTER SPONGE SURGICOUNT (BAG) ×1 IMPLANT
BAG DECANTER FOR FLEXI CONT (MISCELLANEOUS) ×1 IMPLANT
BAG SPEC THK2 15X12 ZIP CLS (MISCELLANEOUS) ×1
BAG SPNG CNTER NS LX DISP (BAG) ×1
BAG ZIPLOCK 12X15 (MISCELLANEOUS) ×1 IMPLANT
BASE TIBIA ATTUNE KNEE SYS SZ6 (Knees) IMPLANT
BLADE SAGITTAL 25.0X1.19X90 (BLADE) ×1 IMPLANT
BLADE SAW SGTL 13X75X1.27 (BLADE) ×1 IMPLANT
BLADE SURG 15 STRL LF DISP TIS (BLADE) ×1 IMPLANT
BLADE SURG 15 STRL SS (BLADE) ×1
BLADE SURG SZ10 CARB STEEL (BLADE) ×2 IMPLANT
BNDG CMPR MED 15X6 ELC VLCR LF (GAUZE/BANDAGES/DRESSINGS) ×1
BNDG ELASTIC 6X15 VLCR STRL LF (GAUZE/BANDAGES/DRESSINGS) ×1 IMPLANT
BONE CEMENT GENTAMICIN (Cement) ×2 IMPLANT
BOWL SMART MIX CTS (DISPOSABLE) ×1 IMPLANT
BSPLAT TIB 6 CMNT ROT PLAT STR (Knees) ×1 IMPLANT
CEMENT BONE GENTAMICIN 40 (Cement) IMPLANT
CLSR STERI-STRIP ANTIMIC 1/2X4 (GAUZE/BANDAGES/DRESSINGS) ×2 IMPLANT
COVER SURGICAL LIGHT HANDLE (MISCELLANEOUS) ×1 IMPLANT
CUFF TOURN SGL QUICK 34 (TOURNIQUET CUFF) ×1
CUFF TRNQT CYL 34X4.125X (TOURNIQUET CUFF) ×1 IMPLANT
DRAPE INCISE IOBAN 66X45 STRL (DRAPES) ×1 IMPLANT
DRAPE U-SHAPE 47X51 STRL (DRAPES) ×1 IMPLANT
DRESSING AQUACEL AG SP 3.5X10 (GAUZE/BANDAGES/DRESSINGS) ×1 IMPLANT
DRSG AQUACEL AG SP 3.5X10 (GAUZE/BANDAGES/DRESSINGS) ×1
DURAPREP 26ML APPLICATOR (WOUND CARE) ×2 IMPLANT
ELECT REM PT RETURN 15FT ADLT (MISCELLANEOUS) ×1 IMPLANT
GLOVE BIOGEL PI IND STRL 8 (GLOVE) ×2 IMPLANT
GLOVE BIOGEL PI INDICATOR 8 (GLOVE) ×2
GLOVE SURG ORTHO 8.0 STRL STRW (GLOVE) ×1 IMPLANT
GLOVE SURG POLYISO LF SZ7.5 (GLOVE) ×1 IMPLANT
GOWN STRL REUS W/ TWL XL LVL3 (GOWN DISPOSABLE) ×2 IMPLANT
GOWN STRL REUS W/TWL XL LVL3 (GOWN DISPOSABLE) ×2
HANDPIECE INTERPULSE COAX TIP (DISPOSABLE) ×1
HOLDER FOLEY CATH W/STRAP (MISCELLANEOUS) IMPLANT
HOOD PEEL AWAY FLYTE STAYCOOL (MISCELLANEOUS) ×1 IMPLANT
IMMOBILIZER KNEE 20 (SOFTGOODS) ×1
IMMOBILIZER KNEE 20 THIGH 36 (SOFTGOODS) ×1 IMPLANT
IMMOBILIZER KNEE 22 UNIV (SOFTGOODS) IMPLANT
KIT TURNOVER KIT A (KITS) IMPLANT
MANIFOLD NEPTUNE II (INSTRUMENTS) ×1 IMPLANT
NEEDLE HYPO 22GX1.5 SAFETY (NEEDLE) ×2 IMPLANT
NS IRRIG 1000ML POUR BTL (IV SOLUTION) ×1 IMPLANT
PACK TOTAL KNEE CUSTOM (KITS) ×1 IMPLANT
PROTECTOR NERVE ULNAR (MISCELLANEOUS) ×1 IMPLANT
SET HNDPC FAN SPRY TIP SCT (DISPOSABLE) ×1 IMPLANT
SPIKE FLUID TRANSFER (MISCELLANEOUS) ×2 IMPLANT
SUT ETHIBOND NAB CT1 #1 30IN (SUTURE) ×2 IMPLANT
SUT MNCRL AB 3-0 PS2 18 (SUTURE) ×1 IMPLANT
SUT VIC AB 0 CT1 36 (SUTURE) ×1 IMPLANT
SUT VIC AB 2-0 CT1 27 (SUTURE) ×2
SUT VIC AB 2-0 CT1 TAPERPNT 27 (SUTURE) ×2 IMPLANT
SYR CONTROL 10ML LL (SYRINGE) ×3 IMPLANT
TIBIA ATTUNE KNEE SYS BASE SZ6 (Knees) ×1 IMPLANT
TOWEL OR 17X26 10 PK STRL BLUE (TOWEL DISPOSABLE) ×1 IMPLANT
TRAY FOLEY MTR SLVR 16FR STAT (SET/KITS/TRAYS/PACK) ×1 IMPLANT
TUBE SUCTION HIGH CAP CLEAR NV (SUCTIONS) ×1 IMPLANT
WATER STERILE IRR 1000ML POUR (IV SOLUTION) ×2 IMPLANT
WRAP KNEE MAXI GEL POST OP (GAUZE/BANDAGES/DRESSINGS) ×1 IMPLANT

## 2022-05-28 NOTE — Anesthesia Postprocedure Evaluation (Signed)
Anesthesia Post Note  Patient: Winslow R Wickliff  Procedure(s) Performed: TOTAL KNEE ARTHROPLASTY (Left: Knee)     Patient location during evaluation: PACU Anesthesia Type: General Level of consciousness: awake and alert Pain management: pain level controlled Vital Signs Assessment: post-procedure vital signs reviewed and stable Respiratory status: spontaneous breathing, nonlabored ventilation, respiratory function stable and patient connected to nasal cannula oxygen Cardiovascular status: blood pressure returned to baseline and stable Postop Assessment: no apparent nausea or vomiting Anesthetic complications: yes   Encounter Notable Events  Notable Event Outcome Phase Comment  Difficult to intubate - unexpected  Intraprocedure Filed from anesthesia note documentation.    Last Vitals:  Vitals:   05/28/22 1400 05/28/22 1520  BP: (!) 167/97 (!) 135/95  Pulse:  77  Resp: (!) 23 19  Temp:  (!) 36.3 C  SpO2: 94% 94%    Last Pain:  Vitals:   05/28/22 1520  TempSrc:   PainSc: 0-No pain                 Santa Lighter

## 2022-05-28 NOTE — Transfer of Care (Signed)
Immediate Anesthesia Transfer of Care Note  Patient: Carl [name]  Procedure(s) Performed: TOTAL KNEE ARTHROPLASTY (Left: Knee)  Patient Location: PACU  Anesthesia Type:General  Level of Consciousness: awake, alert , oriented and patient cooperative  Airway & Oxygen Therapy: Patient Spontanous Breathing and Patient connected to face mask oxygen  Post-op Assessment: Report given to RN, Post -op Vital signs reviewed and stable and Patient moving all extremities X 4  Post vital signs: Reviewed and stable  Last Vitals:  Vitals Value Taken Time  BP 113/69 05/28/22 1045  Temp    Pulse 58 05/28/22 1047  Resp 15 05/28/22 1047  SpO2 91 % 05/28/22 1047  Vitals shown include unvalidated device data.  Last Pain:  Vitals:   05/28/22 0640  TempSrc: Oral  PainSc:       Patients Stated Pain Goal: 3 (02/12/32 5825)  Complications:  Encounter Notable Events  Notable Event Outcome Phase Comment  Difficult to intubate - unexpected  Intraprocedure Filed from anesthesia note documentation.

## 2022-05-28 NOTE — Op Note (Signed)
[name], Carl [name] MEDICAL RECORD NO: [id number] ACCOUNT NO: [id number] DATE OF BIRTH: 1956-08-02 FACILITY: [facility] LOCATION: [facility] PHYSICIAN: W D. Valeta Harms., MD  Operative Report   DATE OF PROCEDURE: 05/28/2022  PREOPERATIVE DIAGNOSIS:  Severe osteoarthritis, left knee with a varus deformity, flexion contracture.  POSTOPERATIVE DIAGNOSIS:  Severe osteoarthritis, left knee with a varus deformity, flexion contracture.  PROCEDURES:  Left total knee replacement (DePuy Attune cemented knee), size 6 femur, tibia 5 mm bearing with 41 mm all poly patella.  SURGEON:  Dr. French Ana.  ASSISTANT:  None.  ANESTHESIA:  General.  TOURNIQUET TIME:  80 minutes.  DESCRIPTION OF PROCEDURE:  Supine position exsanguination of the thigh tourniquet inflation to 350.  Straight skin incision was made with a medial parapatellar approach to the knee made.  We did a 5-degree valgus cut with a 10 mm resection on the femur  followed by cutting about 3 mm below the most diseased medial compartment to obtain full extension with a 5 mm spacer block.  We resected the posterior osteophytes from particularly the medial side of the knee as well as resection of the ACL and PCL.   Femur was sized to be a size 5.  Placement of the 4-in-1 cutting block in the appropriate degree of external rotation, accomplishing the anterior, posterior and chamfer cuts.  Tibia was sized to be a size 6 as well with the keel cut for the tibia,  followed by placement of the tibial trial. Box cut was made on the femur and the patella was cut with a 9.5 mm resection.  Posterior osteophytes were removed from the posterior aspect of the knee.  The capsular and subcutaneous tissues were injected with  a mixture of Marcaine and Exparel.  Trial components were assembled. Patient had resolution of his varus deformity and flexion contracture with full extension, excellent balancing of the ligaments.  Cement was prepared on the back table due to the   patient being borderline diabetic, Staph positive, on anticoagulation, history, we elected to use antibiotic impregnated cement.  Cement was inserted in the doughy state, tibia followed by femur, patella.  Cement was allowed to harden with the trial  bearing in.  Trial bearing was removed.  Excess cement was removed from the posterior aspect of the knee.  Tourniquet was released under direct vision.  No excessive bleeding was noted.  Small bleeders were coagulated.  Topical TXA was used as well.   Final bearing was placed.  Closure was affected with #1 Ethibond, 2-0 Vicryl and Monocryl in the skin.  Taken to recovery room in stable condition.   MUK D: 05/28/2022 10:01:41 am T: 05/28/2022 10:11:00 am  JOB: [id number]/ [id number]

## 2022-05-28 NOTE — Anesthesia Procedure Notes (Signed)
Anesthesia Regional Block: Adductor canal block   Pre-Anesthetic Checklist: , timeout performed,  Correct Patient, Correct Site, Correct Laterality,  Correct Procedure, Correct Position, site marked,  Risks and benefits discussed,  Surgical consent,  Pre-op evaluation,  At surgeon's request and post-op pain management  Laterality: Left  Prep: chloraprep       Needles:  Injection technique: Single-shot  Needle Type: Echogenic Needle     Needle Length: 9cm  Needle Gauge: 21     Additional Needles:   Procedures:,,,, ultrasound used (permanent image in chart),,    Narrative:  Start time: 05/28/2022 6:53 AM End time: 05/28/2022 6:59 AM Injection made incrementally with aspirations every 5 mL.  Performed by: Personally  Anesthesiologist: Santa Lighter, MD  Additional Notes: No pain on injection. No increased resistance to injection. Injection made in 5cc increments.  Good needle visualization.  Patient tolerated procedure well.

## 2022-05-28 NOTE — Progress Notes (Signed)
Orthopedic Tech Progress Note Patient Details:  Carl [name] [birth year] [id number] Bone Foam was given to PACU nurse  Ortho Devices Type of Ortho Device: Bone foam zero knee Ortho Device/Splint Location: LLE Ortho Device/Splint Interventions: Ordered      Tania Perrott E Emmogene Simson 05/28/2022, 10:38 AM

## 2022-05-28 NOTE — Anesthesia Procedure Notes (Signed)
Procedure Name: Intubation Date/Time: 05/28/2022 7:50 AM  Performed by: Santa Lighter, MDPre-anesthesia Checklist: Patient identified, Emergency Drugs available, Suction available and Patient being monitored Patient Re-evaluated:Patient Re-evaluated prior to induction Oxygen Delivery Method: Circle system utilized Preoxygenation: Pre-oxygenation with 100% oxygen Induction Type: IV induction Ventilation: Mask ventilation without difficulty Laryngoscope Size: Glidescope and 4 Grade View: Grade II Tube type: Oral Tube size: 7.5 mm Number of attempts: 3 Airway Equipment and Method: Stylet, Oral airway, Video-laryngoscopy and Rigid stylet Placement Confirmation: ETT inserted through vocal cords under direct vision, positive ETCO2 and breath sounds checked- equal and bilateral Secured at: 23 cm Tube secured with: Tape Dental Injury: Teeth and Oropharynx as per pre-operative assessment  Difficulty Due To: Difficulty was unanticipated, Difficult Airway- due to large tongue and Difficult Airway- due to immobile epiglottis Future Recommendations: Recommend- induction with short-acting agent, and alternative techniques readily available

## 2022-05-28 NOTE — Evaluation (Signed)
Physical Therapy Evaluation Patient Details Name: Carl [name] MRN: [id number] DOB: 01/26/1956 Today's Date: 05/28/2022  History of Present Illness  Pt is a 66yo male presenting s/p L-TKA on 05/28/22. PMH: GERD, hx of DVT, HLD, OSA on CPAP, DM, bilateral carpel tunnel release.  Clinical Impression  Carl [name] is a 66 y.o. male POD 0 s/p L-TKA. Patient reports independence with mobility at baseline. Patient is now limited by functional impairments (see PT problem list below) and requires min guard for transfers and gait with RW. Patient was able to ambulate 100 feet with RW and min guard and cues for safe walker management. Patient educated on safe sequencing for stair mobility and verbalized safe guarding position for people assisting with mobility. Patient instructed in exercises to facilitate ROM and circulation. Patient will benefit from continued skilled PT interventions to address impairments and progress towards PLOF. Patient has met mobility goals at adequate level for discharge home; will continue to follow if pt continues acute stay to progress towards Mod I goals.        Recommendations for follow up therapy are one component of a multi-disciplinary discharge planning process, led by the attending physician.  Recommendations may be updated based on patient status, additional functional criteria and insurance authorization.  Follow Up Recommendations Follow physician's recommendations for discharge plan and follow up therapies      Assistance Recommended at Discharge Set up Supervision/Assistance  Patient can return home with the following  A little help with walking and/or transfers;A little help with bathing/dressing/bathroom;Assistance with cooking/housework;Assist for transportation;Help with stairs or ramp for entrance    Equipment Recommendations None recommended by PT  Recommendations for Other Services       Functional Status Assessment Patient has had a recent  decline in their functional status and demonstrates the ability to make significant improvements in function in a reasonable and predictable amount of time.     Precautions / Restrictions Precautions Precautions: Knee Precaution Booklet Issued: No Precaution Comments: no pillow under knee Required Braces or Orthoses: Knee Immobilizer - Left Knee Immobilizer - Left: On when out of bed or walking Restrictions Weight Bearing Restrictions: No Other Position/Activity Restrictions: wbat      Mobility  Bed Mobility Overal bed mobility: Modified Independent             General bed mobility comments: increased time    Transfers Overall transfer level: Needs assistance Equipment used: Rolling walker (2 wheels) Transfers: Sit to/from Stand Sit to Stand: Min guard, From elevated surface           General transfer comment: For safety only    Ambulation/Gait Ambulation/Gait assistance: Min guard, Supervision Gait Distance (Feet): 100 Feet Assistive device: Rolling walker (2 wheels) Gait Pattern/deviations: Step-to pattern Gait velocity: decreased     General Gait Details: Pt ambulated with RW and min guard progressed to supervision, no physical assist required or overt LOB noted.  Stairs Stairs: Yes Stairs assistance: Min guard Stair Management: Two rails, Step to pattern, Forwards Number of Stairs: 2 General stair comments: Pt completed stair mobility with min guard, VCs for sequencing, no overt LOB noted or physical assist required  Wheelchair Mobility    Modified Rankin (Stroke Patients Only)       Balance Overall balance assessment: Needs assistance Sitting-balance support: Feet supported, No upper extremity supported Sitting balance-Leahy Scale: Good     Standing balance support: Reliant on assistive device for balance, During functional activity, Bilateral upper extremity supported Standing balance-Leahy Scale:   Poor                                Pertinent Vitals/Pain Pain Assessment Pain Assessment: No/denies pain    Home Living Family/patient expects to be discharged to:: Private residence Living Arrangements: Alone Available Help at Discharge: Family;Available PRN/intermittently (sister 24/7 through Sunday) Type of Home: House Home Access: Stairs to enter Entrance Stairs-Rails: None Entrance Stairs-Number of Steps: 1   Home Layout: One level Home Equipment: Conservation officer, nature (2 wheels);Shower seat      Prior Function Prior Level of Function : Independent/Modified Independent;History of Falls (last six months);Driving             Mobility Comments: IND ADLs Comments: IND     Hand Dominance        Extremity/Trunk Assessment   Upper Extremity Assessment Upper Extremity Assessment: Overall WFL for tasks assessed    Lower Extremity Assessment Lower Extremity Assessment: RLE deficits/detail;LLE deficits/detail RLE Deficits / Details: MMT ank DF/PF 5/5 RLE Sensation: WNL LLE Deficits / Details: MMT ank DF/PF 5/5, no extensor lag noted LLE Sensation: WNL    Cervical / Trunk Assessment Cervical / Trunk Assessment: Normal  Communication   Communication: No difficulties  Cognition Arousal/Alertness: Awake/alert Behavior During Therapy: WFL for tasks assessed/performed Overall Cognitive Status: Within Functional Limits for tasks assessed                                          General Comments      Exercises Total Joint Exercises Ankle Circles/Pumps: AROM, Both, 10 reps Quad Sets: AROM, Left, Other reps (comment) (3) Short Arc Quad: AROM, Left, Other reps (comment) (3) Heel Slides: AROM, Left, Other reps (comment) (3) Hip ABduction/ADduction: AROM, Left, 10 reps Straight Leg Raises: AROM, Left, Other reps (comment) (3)   Assessment/Plan    PT Assessment Patient needs continued PT services  PT Problem List Decreased strength;Decreased range of motion;Decreased activity  tolerance;Decreased balance;Decreased mobility;Decreased coordination;Pain       PT Treatment Interventions DME instruction;Gait training;Stair training;Functional mobility training;Therapeutic activities;Therapeutic exercise;Balance training;Neuromuscular re-education;Patient/family education    PT Goals (Current goals can be found in the Care Plan section)  Acute Rehab PT Goals Patient Stated Goal: reduce pain PT Goal Formulation: With patient Time For Goal Achievement: 06/04/22 Potential to Achieve Goals: Good    Frequency 7X/week     Co-evaluation               AM-PAC PT "6 Clicks" Mobility  Outcome Measure Help needed turning from your back to your side while in a flat bed without using bedrails?: None Help needed moving from lying on your back to sitting on the side of a flat bed without using bedrails?: None Help needed moving to and from a bed to a chair (including a wheelchair)?: A Little Help needed standing up from a chair using your arms (e.g., wheelchair or bedside chair)?: A Little Help needed to walk in hospital room?: A Little Help needed climbing 3-5 steps with a railing? : A Little 6 Click Score: 20    End of Session Equipment Utilized During Treatment: Gait belt Activity Tolerance: No increased pain;Patient tolerated treatment well Patient left: in chair;with call bell/phone within reach;with family/visitor present Nurse Communication: Mobility status PT Visit Diagnosis: Pain;Difficulty in walking, not elsewhere classified (R26.2) Pain - Right/Left: Left  Pain - part of body: Knee    Time: 6734-1937 PT Time Calculation (min) (ACUTE ONLY): 38 min   Charges:   PT Evaluation $PT Eval Low Complexity: 1 Low PT Treatments $Gait Training: 8-22 mins $Therapeutic Exercise: 8-22 mins        Coolidge Breeze, PT, DPT [facility] Department Office: [phone] Pager: [phone]  Coolidge Breeze 05/28/2022, 3:19 PM

## 2022-05-28 NOTE — Interval H&P Note (Signed)
History and Physical Interval Note:  05/28/2022 7:42 AM  Carl [name]  has presented today for surgery, with the diagnosis of OA LEFT KNEE.  The various methods of treatment have been discussed with the patient and family. After consideration of risks, benefits and other options for treatment, the patient has consented to  Procedure(s): TOTAL KNEE ARTHROPLASTY (Left) as a surgical intervention.  The patient's history has been reviewed, patient examined, no change in status, stable for surgery.  I have reviewed the patient's chart and labs.  Questions were answered to the patient's satisfaction.     Yvette Rack

## 2022-06-01 ENCOUNTER — Encounter (HOSPITAL_COMMUNITY): Payer: Self-pay | Admitting: Orthopedic Surgery

## 2022-08-30 ENCOUNTER — Other Ambulatory Visit: Payer: Self-pay | Admitting: Gastroenterology

## 2022-10-06 ENCOUNTER — Encounter: Payer: Self-pay | Admitting: Internal Medicine

## 2022-10-14 ENCOUNTER — Telehealth: Payer: Self-pay | Admitting: *Deleted

## 2022-10-14 NOTE — Telephone Encounter (Signed)
Pre-operative Risk Assessment    Patient Name: Carl [name]  DOB: 11/05/1955 MRN: [id number]       Request for Surgical Clearance     Procedure:   RIGHT TOTAL KNEE REPLACEMENT   Date of Surgery:  TBD  3. What is the name of the Surgeon, the Surgeon's Group or Practice, phone and fax number?  Press F2 and list below :1}  Surgeon:  Earlie Server  Surgeon's Group or Practice Name:  [facility] Phone number:  [phone] Fax number:  [phone]   Type of Clearance Requested:   - Pharmacy:  Hold Warfarin (Coumadin)    5. What type of anesthesia will be used?  Press F2 and select the anesthesia to be used for the procedure.  : Type of Anesthesia:   SPINAL  6. Are there any other requests or questions from the surgeon?     Additional requests/questions:

## 2022-10-15 ENCOUNTER — Ambulatory Visit: Payer: Medicare Other | Attending: Nurse Practitioner | Admitting: Nurse Practitioner

## 2022-10-15 ENCOUNTER — Encounter: Payer: Self-pay | Admitting: Nurse Practitioner

## 2022-10-15 VITALS — BP 116/72 | HR 70 | Ht 69.0 in | Wt 255.6 lb

## 2022-10-15 DIAGNOSIS — Z01810 Encounter for preprocedural cardiovascular examination: Secondary | ICD-10-CM | POA: Diagnosis not present

## 2022-10-15 DIAGNOSIS — I10 Essential (primary) hypertension: Secondary | ICD-10-CM | POA: Diagnosis not present

## 2022-10-15 DIAGNOSIS — D6851 Activated protein C resistance: Secondary | ICD-10-CM

## 2022-10-15 DIAGNOSIS — E669 Obesity, unspecified: Secondary | ICD-10-CM

## 2022-10-15 DIAGNOSIS — G4733 Obstructive sleep apnea (adult) (pediatric): Secondary | ICD-10-CM

## 2022-10-15 DIAGNOSIS — I82402 Acute embolism and thrombosis of unspecified deep veins of left lower extremity: Secondary | ICD-10-CM

## 2022-10-15 DIAGNOSIS — I2510 Atherosclerotic heart disease of native coronary artery without angina pectoris: Secondary | ICD-10-CM

## 2022-10-15 DIAGNOSIS — I25119 Atherosclerotic heart disease of native coronary artery with unspecified angina pectoris: Secondary | ICD-10-CM

## 2022-10-15 DIAGNOSIS — E785 Hyperlipidemia, unspecified: Secondary | ICD-10-CM

## 2022-10-15 NOTE — Progress Notes (Signed)
Cardiology Office Note:    Date:  10/15/2022  ID:  [name] Carl, DOB 01-28-1956, MRN [id number]  PCP:  [name], MD   [facility] Cardiologist:  Rozann Lesches, MD     Referring MD: [name], MD   CC: Here for pre-operative cardiovascular risk assessment  History of Present Illness:    Carl [name] is a 67 y.o. male with a hx of the following:  Nonobstructive CAD Factor V Leyden mutation, history of recurrent left leg DVT OSA on CPAP Hypertension Hyperlipidemia Type 2 diabetes  Patient is a 67 year old male with past medical history as mentioned above.  Last seen by Carl Rives, PA-C on May 10, 2022 via telehealth visit for preoperative cardiovascular risk assessment.  Was doing well at the time.  Was able to complete greater than 4 METS of [place].  Our office has been contacted recently as patient is pending right total knee replacement.  Date of surgery is TBD.  Surgeon will be Dr. Earlie [name] with [facility].  Our office has been contacted regarding holding Coumadin.  Type of anesthesia will be spinal.  Today he presents for follow-up.  He states he is doing well. Denies any chest pain, shortness of breath, palpitations, syncope, presyncope, dizziness, orthopnea, PND, swelling or significant weight changes, acute bleeding, or claudication.   Past Medical History:  Diagnosis Date   Arthritis    Cancer ([place])    skin cancer   Carpal tunnel syndrome    Essential hypertension    Factor 5 Leiden mutation, heterozygous ([place])    GERD (gastroesophageal reflux disease)    History of DVT (deep vein thrombosis) 2020   Left leg   History of kidney stones    Hyperlipidemia    OSA on CPAP    Pre-diabetes     Past Surgical History:  Procedure Laterality Date   ANKLE SURGERY  1996   Left   CARPAL TUNNEL RELEASE Left 04/08/2015   Procedure: LEFT CARPAL TUNNEL RELEASE;   Surgeon: Carl Brod, MD;  Location: [facility];  Service: Orthopedics;  Laterality: Left;   CARPAL TUNNEL RELEASE Right 05/06/2015   Procedure: RIGHT CARPAL TUNNEL RELEASE;  Surgeon: Carl Brod, MD;  Location: [facility];  Service: Orthopedics;  Laterality: Right;  ANESTHESIA:  IV REGIONAL FAB   COLONOSCOPY  02/2010   Dr. Oneida Alar: Extremely redundant and tortuous sigmoid and transverse colon.  Normal ileocecal valve, cecum could not be intubated.  Noted to have diverticulosis.  Advised to have follow-up screening colonoscopy in 5 years at [facility] overtube and fluoroscopy.   ESOPHAGOGASTRODUODENOSCOPY N/A 03/09/2018   Fields: Benign-appearing esophageal stricture, small hiatal hernia, mild gastritis with benign biopsies.  No H. pylori.   HERNIA REPAIR     INSERTION OF MESH N/A 04/10/2014   Procedure: INSERTION OF MESH;  Surgeon: Carl So, MD;  Location: [facility];  Service: General;  Laterality: N/A;   INTRAVASCULAR PRESSURE WIRE/FFR STUDY N/A 09/09/2021   Procedure: INTRAVASCULAR PRESSURE WIRE/FFR STUDY;  Surgeon: Carl Man, MD;  Location: [facility];  Service: Cardiovascular;  Laterality: N/A;   LEFT HEART CATH AND CORONARY ANGIOGRAPHY N/A 09/09/2021   Procedure: LEFT HEART CATH AND CORONARY ANGIOGRAPHY;  Surgeon: Carl Man, MD;  Location: [place] [facility];  Service: Cardiovascular;  Laterality: N/A;   SAVORY DILATION N/A 03/09/2018   Procedure: SAVORY DILATION;  Surgeon: Carl Binder, MD;  [facility];  Service: Endoscopy;  Laterality: N/A;   TOTAL KNEE ARTHROPLASTY Left 05/28/2022   Procedure: TOTAL KNEE ARTHROPLASTY;  Surgeon: Carl Server, MD;  Location: [facility];  Service: Orthopedics;  Laterality: Left;   UMBILICAL HERNIA REPAIR N/A 04/10/2014   Procedure: HERNIA REPAIR UMBILICAL ADULT;  Surgeon: Carl So, MD;  Location: [facility];  Service: General;  Laterality: N/A;    Current  Medications: Current Meds  Medication Sig   acetaminophen (TYLENOL) 500 MG tablet Take 500-1,000 mg by mouth every 6 (six) hours as needed for moderate pain or headache.   amLODipine (NORVASC) 10 MG tablet Take 10 mg by mouth daily.   Ascorbic Acid (VITAMIN C) 1000 MG tablet Take 1,000 mg by mouth at bedtime.   Cholecalciferol (VITAMIN D) 50 MCG (2000 UT) tablet Take 2,000 Units by mouth at bedtime.   escitalopram (LEXAPRO) 10 MG tablet Take 10 mg by mouth at bedtime.   metoprolol succinate (TOPROL-XL) 25 MG 24 hr tablet TAKE 1/2 TABLET BY MOUTH EVERY DAY   omeprazole (PRILOSEC) 20 MG capsule Take 1 capsule (20 mg total) by mouth daily.   oxymetazoline (AFRIN) 0.05 % nasal spray Place 2 sprays into both nostrils at bedtime.    pantoprazole (PROTONIX) 40 MG tablet Take 40 mg by mouth every morning.   rosuvastatin (CRESTOR) 10 MG tablet Take 10 mg by mouth at bedtime.   traZODone (DESYREL) 50 MG tablet Take 50 mg by mouth at bedtime.   valsartan (DIOVAN) 160 MG tablet Take 160 mg by mouth daily.   warfarin (COUMADIN) 5 MG tablet Take 2.5-5 mg by mouth See admin instructions. Take 5 mg one day then take 2.5 mg daily for 2 days then repeat    Allergies:   Amoxicillin and Other   Social History   Socioeconomic History   Marital status: Married    Spouse name: Nevin Bloodgood   Number of children: Y   Years of education: Not on file   Highest education level: Not on file  Occupational History   Occupation: med Editor, commissioning  Tobacco Use   Smoking status: Never   Smokeless tobacco: Never  Vaping Use   Vaping Use: Never used  Substance and Sexual Activity   Alcohol use: Yes    Comment: Occasional   Drug use: No   Sexual activity: Not Currently  Other Topics Concern   Not on file  Social History Narrative   RETIRED FROM [employer].   Social Determinants of Health   Financial Resource Strain: Not on file  Food Insecurity: Not on file  Transportation Needs: Not on file   Physical Activity: Not on file  Stress: Not on file  Social Connections: Not on file     Family History: The patient's family history includes COPD in his father; Clotting disorder in his father; Colon cancer in his nephew; Diabetes in his mother; Hypertension in his father; Rheum arthritis in his sister. There is no history of Colon polyps.  ROS:   Review of Systems  Constitutional: Negative.   HENT: Negative.    Eyes: Negative.   Respiratory: Negative.    Cardiovascular: Negative.   Gastrointestinal: Negative.   Genitourinary: Negative.   Musculoskeletal: Negative.   Skin: Negative.   Neurological: Negative.   Endo/Heme/Allergies: Negative.   Psychiatric/Behavioral: Negative.      Please see the history of present illness.    All other systems reviewed and are negative.  EKGs/Labs/Other Studies Reviewed:  The following studies were reviewed today:   EKG:  EKG is ordered today.  The ekg ordered today demonstrates normal sinus rhythm, 69 bpm, no acute ischemic changes.   Left heart cath on 09/09/2021:   Mid RCA lesion is 55% stenosed.  RFR 0.97   Mid LAD to Dist LAD lesion is 40% stenosed.   The left ventricular systolic function is normal.   LV end diastolic pressure is normal.   The left ventricular ejection fraction is 55-65% by visual estimate.   There is no aortic valve stenosis.   SUMMARY Angiographically moderate mid RCA & LAD disease, with no focal lesion to explain symptoms.  Overall large caliber vessels. RFR negative mid RCA segmental lesion (RFR 0.97) Normal LV function with a normal EDP      RECOMMENDATIONS Consider other nonischemic etiologies for concerning symptoms. Discharged home today after bedrest somewhat delayed because of x-ray have administered.     Follow-up with Dr. Domenic Polite  Echo on 08/31/2021: 1. Left ventricular ejection fraction, by estimation, is 70 to 75%. The  left ventricle has hyperdynamic function. The left ventricle has  no  regional wall motion abnormalities. There is moderate left ventricular  hypertrophy. Left ventricular diastolic  parameters are indeterminate.   2. Right ventricular systolic function is normal. The right ventricular  size is normal. Tricuspid regurgitation signal is inadequate for assessing  PA pressure.   3. The mitral valve is grossly normal. Trivial mitral valve  regurgitation.   4. The aortic valve is tricuspid. Aortic valve regurgitation is not  visualized. Aortic valve sclerosis is present, with no evidence of aortic  valve stenosis.   5. The inferior vena cava is normal in size with greater than 50%  respiratory variability, suggesting right atrial pressure of 3 mmHg.   Comparison(s): No prior Echocardiogram.  Myoview on 08/25/2021:   The study is low risk.   Patient exercised according to Bruce protocol for 7:46mn achieving 10.1 METs. Target HR was achieved with max HR 142 (91% MPHR)   There were no ST deviations noted during exercise ECG.   LV perfusion is abnormal. There is a small defect with mild reduction in uptake present in the apical to mid anteroseptal location(s) that is reversible consistent with possible ischemia. May also be related to RV insertion point as wall motion appears normal.   Left ventricular function is normal. End diastolic cavity size is normal.  Venous [place] lower unilateral (DVT) on 09/04/2019: IMPRESSION: 1. Age-indeterminate, though potentially acute, occlusive DVT involving the left posterior tibial vein. 2. 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    Venous [place] lower unilateral (DVT) on 05/16/2019: IMPRESSION: Sonographic survey positive for nonocclusive DVT of the popliteal vein.  Recent Labs: 05/21/2022: ALT 33; BUN 11; Creatinine, Ser 0.66; Hemoglobin 14.5; Platelets 235; Potassium 3.7; Sodium 139  Recent  Lipid Panel    Component Value Date/Time   CHOL 181 05/30/2019 0845   TRIG 82 05/30/2019 0845   HDL 45 05/30/2019 0845   CHOLHDL 4.0 05/30/2019 0845   VLDL 16 05/30/2019 0845   LDLCALC 120 (H) 05/30/2019 0845    Physical Exam:    VS:  BP 116/72   Pulse 70   Ht '5\' 9"'$  (1.753 m)   Wt 255 lb 9.6 oz (115.9 kg)   SpO2 94%   BMI 37.75 kg/m     Wt Readings from Last  3 Encounters:  10/15/22 255 lb 9.6 oz (115.9 kg)  05/28/22 256 lb (116.1 kg)  05/21/22 256 lb (116.1 kg)     GEN: Obese, 67 y.o. male in no acute distress HEENT: Normal NECK: No JVD; No carotid bruits CARDIAC: S1/S2, RRR, no murmurs, rubs, gallops; 2+ pulses RESPIRATORY:  Clear to auscultation without rales, wheezing or rhonchi  MUSCULOSKELETAL:  Nonpitting lower extremity edema (L>R) chronic; No deformity  SKIN: Warm and dry NEUROLOGIC:  Alert and oriented x 3 PSYCHIATRIC:  Normal affect   ASSESSMENT:    1. Pre-operative cardiovascular examination   2. Coronary artery disease involving native coronary artery of native heart without angina pectoris   3. Hypertension, unspecified type   4. Hyperlipidemia, unspecified hyperlipidemia type   5. Factor V Leiden mutation ([place])   6. 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 OSA on CPAP   8. Obesity (BMI 30-39.9)    PLAN:    In order of problems listed above:  Pre-operative Cardiovascular Risk Assessment Mr. Skarda's perioperative risk of a major cardiac event is 0.4% according to the Revised Cardiac Risk Index (RCRI).  Therefore, he is at low risk for perioperative complications.   His functional capacity is excellent at 6.61 METs according to the Duke Activity Status Index (DASI). Recommendations: According to ACC/AHA guidelines, no further cardiovascular testing needed.  The patient may proceed to surgery at acceptable risk.   Antiplatelet and/or Anticoagulation Recommendations: Patient's Coumadin is managed by his primary care  provider. Recommendations for holding Coumadin will need to come from this provider, Dr. Willey Blade.   2. CAD Cardiac catheterization December 2022 showed nonobstructive CAD. Stable with no anginal symptoms. No indication for ischemic evaluation.  Continue Toprol-XL, Crestor, and valsartan. Heart healthy diet and regular cardiovascular exercise encouraged.   3. HTN Blood pressure stable.  BP well-controlled at home.  Continue amlodipine, Toprol-XL, and valsartan. Discussed to monitor BP at home at least 2 hours after medications and sitting for 5-10 minutes. Heart healthy diet and regular cardiovascular exercise encouraged.   4. HLD Being managed by PCP. Continue rosuvastatin. At next OV, plan to have labs faxed to our office. Heart healthy diet and regular cardiovascular exercise encouraged.   5. Factor V Leiden mutation, recurrent DVT of left leg Chronic swelling of bilateral legs, L>R (chronic per his report). Denies any pain or issues. Coumadin is being managed by PCP. Continue to follow with PCP.  6. OSA on CPAP Encouraged continued compliance.  7. Obesity BMI today 37.75. Weight loss via diet and exercise encouraged. Discussed the impact being overweight would have on cardiovascular risk. Heart healthy diet and regular cardiovascular exercise encouraged.   8.  Disposition: Follow-up with Dr. Domenic Polite in 6 months or sooner if anything changes.  Medication Adjustments/Labs and Tests Ordered: Current medicines are reviewed at length with the patient today.  Concerns regarding medicines are outlined above.  Orders Placed This Encounter  Procedures   EKG 12-Lead   No orders of the defined types were placed in this encounter.   Patient Instructions  Medication Instructions:  Your physician recommends that you continue on your current medications as directed. Please refer to the Current Medication list given to you today.  Labwork: none  Testing/Procedures: none  Follow-Up: Your  physician recommends that you schedule a follow-up appointment in: 6 months with Dr. Domenic Polite  Any Other Special Instructions Will Be Listed Below (If Applicable).  If you need a refill on your cardiac medications before  your next appointment, please call your pharmacy.   Signed, Finis Bud, NP  10/18/2022 5:01 AM    [place]

## 2022-10-15 NOTE — Telephone Encounter (Signed)
Spoke with patient who informed me that he was seen today with Finis Bud, NP and was cleared by her.

## 2022-10-15 NOTE — Patient Instructions (Addendum)
Medication Instructions:  Your physician recommends that you continue on your current medications as directed. Please refer to the Current Medication list given to you today.  Labwork: none  Testing/Procedures: none  Follow-Up: Your physician recommends that you schedule a follow-up appointment in: 6 months with Dr. McDowell  Any Other Special Instructions Will Be Listed Below (If Applicable).  If you need a refill on your cardiac medications before your next appointment, please call your pharmacy. 

## 2022-10-15 NOTE — Telephone Encounter (Signed)
Patient's coumadin is managed by PCP. I will defer to PCP.

## 2022-10-15 NOTE — Telephone Encounter (Signed)
   Name: Carl [name]  DOB: 1955/10/21  MRN: [id number]  Primary Cardiologist: Rozann Lesches, MD   Preoperative team, please contact this patient and set up a phone call appointment for further preoperative risk assessment. Please obtain consent and complete medication review. Thank you for your help.  I confirm that guidance regarding antiplatelet and oral anticoagulation therapy has been completed and, if necessary, noted below.  Patient's Coumadin is managed by his PCP.  Recommendations for holding Coumadin will need to come from prescribing provider.   Deberah Pelton, NP 10/15/2022, 12:55 PM [place]

## 2022-11-22 NOTE — Progress Notes (Signed)
Sent message, via epic in basket, requesting orders in epic from surgeon.  

## 2022-11-24 ENCOUNTER — Ambulatory Visit: Payer: Self-pay | Admitting: Physician Assistant

## 2022-11-24 DIAGNOSIS — G8929 Other chronic pain: Secondary | ICD-10-CM

## 2022-11-24 NOTE — H&P (Signed)
TOTAL KNEE ADMISSION H&P  Patient is being admitted for right total knee arthroplasty.  Subjective:  Chief Complaint:right knee pain.  HPI: Carl [name], 67 y.o. male, has a history of pain and functional disability in the right knee due to arthritis and has failed non-surgical conservative treatments for greater than 12 weeks to includeNSAID's and/or analgesics, corticosteriod injections, and activity modification.  Onset of symptoms was gradual, starting 6 years ago with gradually worsening course since that time. The patient noted no past surgery on the right knee(s).  Patient currently rates pain in the right knee(s) at 8 out of 10 with activity. Patient has night pain, worsening of pain with activity and weight bearing, pain that interferes with activities of daily living, pain with passive range of motion, crepitus, and joint swelling.  Patient has evidence of periarticular osteophytes and joint space narrowing by imaging studies. There is no active infection.  Patient Active Problem List   Diagnosis Date Noted  . Anticoagulated on Coumadin 05/27/2022  . Abnormal myocardial perfusion study 09/09/2021  . GERD (gastroesophageal reflux disease) 12/02/2020  . NSAID induced gastritis   . Esophageal dysphagia 02/06/2018  . Screening for colon cancer 02/06/2018  . DIABETES MELLITUS 04/22/2010  . OSA on CPAP 04/22/2010  . Essential hypertension 04/22/2010   Past Medical History:  Diagnosis Date  . Arthritis   . Cancer ([place])    skin cancer  . Carpal tunnel syndrome   . Essential hypertension   . Factor 5 Leiden mutation, heterozygous ([place])   . GERD (gastroesophageal reflux disease)   . History of DVT (deep vein thrombosis) 2020   Left leg  . History of kidney stones   . Hyperlipidemia   . OSA on CPAP   . Pre-diabetes     Past Surgical History:  Procedure Laterality Date  . ANKLE SURGERY  1996   Left  . CARPAL TUNNEL RELEASE Left 04/08/2015   Procedure: LEFT CARPAL TUNNEL  RELEASE;  Surgeon: Daryll Brod, MD;  Location: [place];  Service: Orthopedics;  Laterality: Left;  . CARPAL TUNNEL RELEASE Right 05/06/2015   Procedure: RIGHT CARPAL TUNNEL RELEASE;  Surgeon: Daryll Brod, MD;  Location: [place];  Service: Orthopedics;  Laterality: Right;  ANESTHESIA:  IV REGIONAL FAB  . COLONOSCOPY  02/2010   Dr. Oneida Alar: Extremely redundant and tortuous sigmoid and transverse colon.  Normal ileocecal valve, cecum could not be intubated.  Noted to have diverticulosis.  Advised to have follow-up screening colonoscopy in 5 years at [facility] overtube and fluoroscopy.  . ESOPHAGOGASTRODUODENOSCOPY N/A 03/09/2018   Fields: Benign-appearing esophageal stricture, small hiatal hernia, mild gastritis with benign biopsies.  No H. pylori.  Marland Kitchen HERNIA REPAIR    . INSERTION OF MESH N/A 04/10/2014   Procedure: INSERTION OF MESH;  Surgeon: Jamesetta So, MD;  Location: [facility];  Service: General;  Laterality: N/A;  . INTRAVASCULAR PRESSURE WIRE/FFR STUDY N/A 09/09/2021   Procedure: INTRAVASCULAR PRESSURE WIRE/FFR STUDY;  Surgeon: Leonie Man, MD;  Location: [place] [facility];  Service: Cardiovascular;  Laterality: N/A;  . LEFT HEART CATH AND CORONARY ANGIOGRAPHY N/A 09/09/2021   Procedure: LEFT HEART CATH AND CORONARY ANGIOGRAPHY;  Surgeon: Leonie Man, MD;  Location: [place] [facility];  Service: Cardiovascular;  Laterality: N/A;  . SAVORY DILATION N/A 03/09/2018   Procedure: SAVORY DILATION;  Surgeon: Danie Binder, MD;  Location: [facility];  Service: Endoscopy;  Laterality: N/A;  . TOTAL  KNEE ARTHROPLASTY Left 05/28/2022   Procedure: TOTAL KNEE ARTHROPLASTY;  Surgeon: Earlie Server, MD;  Location: [facility];  Service: Orthopedics;  Laterality: Left;  . UMBILICAL HERNIA REPAIR N/A 04/10/2014   Procedure: HERNIA REPAIR UMBILICAL ADULT;  Surgeon: Jamesetta So, MD;  Location: [facility];  Service: General;  Laterality: N/A;     Current Outpatient Medications  Medication Sig Dispense Refill Last Dose  . acetaminophen (TYLENOL) 500 MG tablet Take 500-1,000 mg by mouth every 6 (six) hours as needed for moderate pain or headache.     Marland Kitchen amLODipine (NORVASC) 10 MG tablet Take 10 mg by mouth daily.     . Ascorbic Acid (VITAMIN C) 1000 MG tablet Take 1,000 mg by mouth at bedtime.     . Cholecalciferol (VITAMIN D) 50 MCG (2000 UT) tablet Take 2,000 Units by mouth at bedtime.     Marland Kitchen escitalopram (LEXAPRO) 10 MG tablet Take 10 mg by mouth at bedtime.     . metoprolol succinate (TOPROL-XL) 25 MG 24 hr tablet TAKE 1/2 TABLET BY MOUTH EVERY DAY 45 tablet 2   . omeprazole (PRILOSEC) 20 MG capsule Take 1 capsule (20 mg total) by mouth daily. 90 capsule 3   . oxymetazoline (AFRIN) 0.05 % nasal spray Place 2 sprays into both nostrils at bedtime.      . pantoprazole (PROTONIX) 40 MG tablet Take 40 mg by mouth every morning.     . rosuvastatin (CRESTOR) 10 MG tablet Take 10 mg by mouth at bedtime.     . traZODone (DESYREL) 50 MG tablet Take 50 mg by mouth at bedtime.     . valsartan (DIOVAN) 160 MG tablet Take 160 mg by mouth daily.     Marland Kitchen warfarin (COUMADIN) 5 MG tablet Take 2.5-5 mg by mouth See admin instructions. Take 5 mg one day then take 2.5 mg daily for 2 days then repeat      Current Facility-Administered Medications  Medication Dose Route Frequency Provider Last Rate Last Admin  . sodium chloride flush (NS) 0.9 % injection 3 mL  3 mL Intravenous Q12H Satira Sark, MD       Allergies  Allergen Reactions  . Amoxicillin Other (See Comments)    Joints ache and can't move Has patient had a PCN reaction causing immediate rash, facial/tongue/throat swelling, SOB or lightheadedness with hypotension: No Has patient had a PCN reaction causing severe rash involving mucus membranes or skin necrosis: No Has patient had a PCN reaction that required hospitalization: No Has patient had a PCN reaction occurring within the last 10  years: Yes If all of the above answers are "NO", then may proceed with Cephalosporin use.    . Other Diarrhea    Alfredo sauce     Social History   Tobacco Use  . Smoking status: Never  . Smokeless tobacco: Never  Substance Use Topics  . Alcohol use: Yes    Comment: Occasional    Family History  Problem Relation Age of Onset  . Diabetes Mother   . Hypertension Father   . Clotting disorder Father   . COPD Father   . Rheum arthritis Sister   . Colon cancer Nephew        78  . Colon polyps Neg Hx      Review of Systems  Cardiovascular:  Positive for chest pain.  Musculoskeletal:  Positive for arthralgias.  All other systems reviewed and are negative.  Objective:  Physical Exam Constitutional:  General: He is not in acute distress.    Appearance: Normal appearance.  HENT:     Head: Normocephalic and atraumatic.  Eyes:     Extraocular Movements: Extraocular movements intact.     Pupils: Pupils are equal, round, and reactive to light.  Cardiovascular:     Rate and Rhythm: Normal rate and regular rhythm.     Pulses: Normal pulses.     Heart sounds: Normal heart sounds.  Pulmonary:     Effort: Pulmonary effort is normal. No respiratory distress.     Breath sounds: Normal breath sounds. No wheezing.  Abdominal:     General: Abdomen is flat. Bowel sounds are normal. There is no distension.     Palpations: Abdomen is soft.     Tenderness: There is no abdominal tenderness.  Musculoskeletal:     Cervical back: Normal range of motion and neck supple.     Comments: : Examination of the right lower extremity again shows he is neurovascularly intact.  Intact dorsiflexion and plantarflexion with the ankle.  No calf tenderness to palpation.  He does have joint line tenderness with the right knee, more so medially.  Good flexion and extension.  Stable varus and valgus.    Lymphadenopathy:     Cervical: No cervical adenopathy.  Skin:    General: Skin is warm and dry.      Findings: No erythema or rash.  Neurological:     General: No focal deficit present.     Mental Status: He is alert and oriented to person, place, and time.  Psychiatric:        Mood and Affect: Mood normal.        Behavior: Behavior normal.   Vital signs in last 24 hours: @VSRANGES$ @  Labs:   Estimated body mass index is 37.75 kg/m as calculated from the following:   Height as of 10/15/22: 5' 9"$  (1.753 m).   Weight as of 10/15/22: 115.9 kg.   Imaging Review Plain radiographs demonstrate moderate degenerative joint disease of the right knee(s). The overall alignment ismild varus. The bone quality appears to be good for age and reported activity level.      Assessment/Plan:  End stage arthritis, right knee   The patient history, physical examination, clinical judgment of the provider and imaging studies are consistent with end stage degenerative joint disease of the right knee(s) and total knee arthroplasty is deemed medically necessary. The treatment options including medical management, injection therapy arthroscopy and arthroplasty were discussed at length. The risks and benefits of total knee arthroplasty were presented and reviewed. The risks due to aseptic loosening, infection, stiffness, patella tracking problems, thromboembolic complications and other imponderables were discussed. The patient acknowledged the explanation, agreed to proceed with the plan and consent was signed. Patient is being admitted for inpatient treatment for surgery, pain control, PT, OT, prophylactic antibiotics, VTE prophylaxis, progressive ambulation and ADL's and discharge planning. The patient is planning to be discharged  home with outpt PT     Patient's anticipated LOS is less than 2 midnights, meeting these requirements: - Younger than 74 - Lives within 1 hour of care - Has a competent adult at home to recover with post-op recover - NO history of  - Chronic pain requiring opiods  -   -  Coronary Artery Disease  - Heart failure  - Heart attack  - Stroke  - DVT/VTE  - Cardiac arrhythmia  - Respiratory Failure/COPD  - Renal failure  - Anemia  -  Advanced Liver disease

## 2022-11-27 NOTE — Patient Instructions (Signed)
SURGICAL WAITING ROOM VISITATION Patients having surgery or a procedure may have no more than 2 support people in the waiting area - these visitors may rotate in the visitor waiting room.   Due to an increase in RSV and influenza rates and associated hospitalizations, children ages 29 and under may not visit patients in [place]. If the patient needs to stay at the hospital during part of their recovery, the visitor guidelines for inpatient rooms apply.  PRE-OP VISITATION  Pre-op nurse will coordinate an appropriate time for 1 support person to accompany the patient in pre-op.  This support person may not rotate.  This visitor will be contacted when the time is appropriate for the visitor to come back in the pre-op area.  Please refer to the [facility] website for the visitor guidelines for Inpatients (after your surgery is over and you are in a regular room).  You are not required to quarantine at this time prior to your surgery. However, you must do this: Hand Hygiene often Do NOT share personal items Notify your provider if you are in close contact with someone who has COVID or you develop fever 100.4 or greater, new onset of sneezing, cough, sore throat, shortness of breath or body aches.  If you test positive for Covid or have been in contact with anyone that has tested positive in the last 10 days please notify you surgeon.    Your procedure is scheduled on:  Wednesday  December 08, 2022  Report to [facility] Main Entrance: [address] entrance where the [employer] is available.   Report to admitting at: 07:45  AM  +++++Call this number if you have any questions or problems the morning of surgery [phone]  Do not eat food after Midnight the night prior to your surgery/procedure.  After Midnight you may have the following liquids until  07:15 AM DAY OF SURGERY  Clear Liquid Diet Water Black Coffee (sugar ok, NO MILK/CREAM OR CREAMERS)  Tea (sugar ok, NO  MILK/CREAM OR CREAMERS) regular and decaf                             Plain Jell-O  with no fruit (NO RED)                                           Fruit ices (not with fruit pulp, NO RED)                                     Popsicles (NO RED)                                                                  Juice: apple, WHITE grape, WHITE cranberry Sports drinks like Gatorade or Powerade (NO RED)                    The day of surgery:  Drink ONE (1) Pre-Surgery G2 at   07:15 AM the morning of surgery. Drink in one sitting. Do not  sip.  This drink was given to you during your hospital pre-op appointment visit. Nothing else to drink after completing the Pre-SurgeryG2 : No candy, chewing gum or throat lozenges.    FOLLOW  ANY ADDITIONAL PRE OP INSTRUCTIONS YOU RECEIVED FROM YOUR SURGEON'S OFFICE!!!   Oral Hygiene is also important to reduce your risk of infection.        Remember - BRUSH YOUR TEETH THE MORNING OF SURGERY WITH YOUR REGULAR TOOTHPASTE  Do NOT smoke after Midnight the night before surgery.  Take ONLY these medicines the morning of surgery with A SIP OF WATER: Metoprolol, pantoprazole (Protonix), amlodipine. You may use Tylenol if needed for pain.    If You have been diagnosed with Sleep Apnea - Bring CPAP mask and tubing day of surgery. We will provide you with a CPAP machine on the day of your surgery.                   You may not have any metal on your body including  jewelry, and body piercing  Do not wear lotions, powders, cologne, or deodorant  Men may shave face and neck.  Patients discharged on the day of surgery will not be allowed to drive home.  Someone NEEDS to stay with you for the first 24 hours after anesthesia.  Do not bring your home medications to the hospital. The Pharmacy will dispense medications listed on your medication list to you during your admission in the Hospital.   Please read over the following fact sheets you were given: IF [place], [place] Zigmund Daniel).   [place] - Preparing for Surgery Before surgery, you can play an important role.  Because skin is not sterile, your skin needs to be as free of germs as possible.  You can reduce the number of germs on your skin by washing with CHG (chlorahexidine gluconate) soap before surgery.  CHG is an antiseptic cleaner which kills germs and bonds with the skin to continue killing germs even after washing. Please DO NOT use if you have an allergy to CHG or antibacterial soaps.  If your skin becomes reddened/irritated stop using the CHG and inform your nurse when you arrive at Short Stay. Do not shave (including legs and underarms) for at least 48 hours prior to the first CHG shower.  You may shave your face/neck.  Please follow these instructions carefully:  1.  Shower with CHG Soap the night before surgery and the  morning of surgery.  2.  If you choose to wash your hair, wash your hair first as usual with your normal  shampoo.  3.  After you shampoo, rinse your hair and body thoroughly to remove the shampoo.                             4.  Use CHG as you would any other liquid soap.  You can apply chg directly to the skin and wash.  Gently with a scrungie or clean washcloth.  5.  Apply the CHG Soap to your body ONLY FROM THE NECK DOWN.   Do not use on face/ open                           Wound or open sores. Avoid contact with eyes, ears mouth and genitals (private parts).  Wash face,  Genitals (private parts) with your normal soap.             6.  Wash thoroughly, paying special attention to the area where your  surgery  will be performed.  7.  Thoroughly rinse your body with warm water from the neck down.  8.  DO NOT shower/wash with your normal soap after using and rinsing off the CHG Soap.            9.  Pat yourself dry with a clean towel.            10.  Wear clean pajamas.            11.  Place clean  sheets on your bed the night of your first shower and do not  sleep with pets.  ON THE DAY OF SURGERY : Do not apply any lotions/deodorants the morning of surgery.  Please wear clean clothes to the hospital/surgery center.    FAILURE TO FOLLOW THESE INSTRUCTIONS MAY RESULT IN THE CANCELLATION OF YOUR SURGERY  PATIENT SIGNATURE_________________________________  NURSE SIGNATURE__________________________________  ________________________________________________________________________       Carl [name]    An incentive spirometer is a tool that can help keep your lungs clear and active. This tool measures how well you are filling your lungs with each breath. Taking long deep breaths may help reverse or decrease the chance of developing breathing (pulmonary) problems (especially infection) following: A long period of time when you are unable to move or be active. BEFORE THE PROCEDURE  If the spirometer includes an indicator to show your best effort, your nurse or respiratory therapist will set it to a desired goal. If possible, sit up straight or lean slightly forward. Try not to slouch. Hold the incentive spirometer in an upright position. INSTRUCTIONS FOR USE  Sit on the edge of your bed if possible, or sit up as far as you can in bed or on a chair. Hold the incentive spirometer in an upright position. Breathe out normally. Place the mouthpiece in your mouth and seal your lips tightly around it. Breathe in slowly and as deeply as possible, raising the piston or the ball toward the top of the column. Hold your breath for 3-5 seconds or for as long as possible. Allow the piston or ball to fall to the bottom of the column. Remove the mouthpiece from your mouth and breathe out normally. Rest for a few seconds and repeat Steps 1 through 7 at least 10 times every 1-2 hours when you are awake. Take your time and take a few normal breaths between deep breaths. The spirometer may  include an indicator to show your best effort. Use the indicator as a goal to work toward during each repetition. After each set of 10 deep breaths, practice coughing to be sure your lungs are clear. If you have an incision (the cut made at the time of surgery), support your incision when coughing by placing a pillow or rolled up towels firmly against it. Once you are able to get out of bed, walk around indoors and cough well. You may stop using the incentive spirometer when instructed by your caregiver.  RISKS AND COMPLICATIONS Take your time so you do not get dizzy or light-headed. If you are in pain, you may need to take or ask for pain medication before doing incentive spirometry. It is harder to take a deep breath if you are having pain. AFTER USE Rest and breathe slowly and  easily. It can be helpful to keep track of a log of your progress. Your caregiver can provide you with a simple table to help with this. If you are using the spirometer at home, follow these instructions: [place] IF:  You are having difficultly using the spirometer. You have trouble using the spirometer as often as instructed. Your pain medication is not giving enough relief while using the spirometer. You develop fever of 100.5 F (38.1 C) or higher.                                                                                                    SEEK IMMEDIATE MEDICAL CARE IF:  You cough up bloody sputum that had not been present before. You develop fever of 102 F (38.9 C) or greater. You develop worsening pain at or near the incision site. MAKE SURE YOU:  Understand these instructions. Will watch your condition. Will get help right away if you are not doing well or get worse. Document Released: 01/24/2007 Document Revised: 12/06/2011 Document Reviewed: 03/27/2007 [facility] Patient Information 2014 [place], [place].      WHAT IS A BLOOD TRANSFUSION? Blood Transfusion Information  A transfusion  is the replacement of blood or some of its parts. Blood is made up of multiple cells which provide different functions. Red blood cells carry oxygen and are used for blood loss replacement. White blood cells fight against infection. Platelets control bleeding. Plasma helps clot blood. Other blood products are available for specialized needs, such as hemophilia or other clotting disorders. BEFORE THE TRANSFUSION  Who gives blood for transfusions?  Healthy volunteers who are fully evaluated to make sure their blood is safe. This is blood bank blood. Transfusion therapy is the safest it has ever been in the practice of medicine. Before blood is taken from a donor, a complete history is taken to make sure that person has no history of diseases nor engages in risky social behavior (examples are intravenous drug use or sexual activity with multiple partners). The donor's travel history is screened to minimize risk of transmitting infections, such as malaria. The donated blood is tested for signs of infectious diseases, such as HIV and hepatitis. The blood is then tested to be sure it is compatible with you in order to minimize the chance of a transfusion reaction. If you or a relative donates blood, this is often done in anticipation of surgery and is not appropriate for emergency situations. It takes many days to process the donated blood. RISKS AND COMPLICATIONS Although transfusion therapy is very safe and saves many lives, the main dangers of transfusion include:  Getting an infectious disease. Developing a transfusion reaction. This is an allergic reaction to something in the blood you were given. Every precaution is taken to prevent this. The decision to have a blood transfusion has been considered carefully by your caregiver before blood is given. Blood is not given unless the benefits outweigh the risks. AFTER THE TRANSFUSION Right after receiving a blood transfusion, you will usually feel much  better and more energetic. This is especially true  if your red blood cells have gotten low (anemic). The transfusion raises the level of the red blood cells which carry oxygen, and this usually causes an energy increase. The nurse administering the transfusion will monitor you carefully for complications. HOME CARE INSTRUCTIONS  No special instructions are needed after a transfusion. You may find your energy is better. Speak with your caregiver about any limitations on activity for underlying diseases you may have. SEEK MEDICAL CARE IF:  Your condition is not improving after your transfusion. You develop redness or irritation at the intravenous (IV) site. SEEK IMMEDIATE MEDICAL CARE IF:  Any of the following symptoms occur over the next 12 hours: Shaking chills. You have a temperature by mouth above 102 F (38.9 C), not controlled by medicine. Chest, back, or muscle pain. People around you feel you are not acting correctly or are confused. Shortness of breath or difficulty breathing. Dizziness and fainting. You get a rash or develop hives. You have a decrease in urine output. Your urine turns a dark color or changes to pink, red, or brown. Any of the following symptoms occur over the next 10 days: You have a temperature by mouth above 102 F (38.9 C), not controlled by medicine. Shortness of breath. Weakness after normal activity. The white part of the eye turns yellow (jaundice). You have a decrease in the amount of urine or are urinating less often. Your urine turns a dark color or changes to pink, red, or brown. Document Released: 09/10/2000 Document Revised: 12/06/2011 Document Reviewed: 04/29/2008 [facility] Patient Information 2014 [place], [place].  _______________________________________________________________________

## 2022-11-27 NOTE — Progress Notes (Signed)
COVID Vaccine received:  '[]'$  No '[x]'$  Yes Date of any COVID positive Test in last 90 days:  PCP - [name], MD   Cardiologist - Rozann Lesches, MD  Cardiac clearance- Finis Bud, NP 10-15-2022  Note   Epic  Chest x-ray - 2017   2v  epic EKG -  10-15-2022  epic Stress Test - 08-25-2021 epic ECHO - 08-31-2021  epic Cardiac Cath -09-09-2021 Epic  (pressure wire/ FFR study)   by Dr. Glenetta Hew     PCR screen: '[x]'$  Ordered & Completed                      '[]'$   No Order but Needs PROFEND                      '[]'$   N/A for this surgery  Surgery Plan:  '[x]'$  Ambulatory                            '[]'$  Outpatient in bed                            '[]'$  Admit  Anesthesia:    '[]'$  General  '[x]'$  Spinal                           '[]'$   Choice '[]'$   MAC  Pacemaker / ICD device '[x]'$  No '[]'$  Yes        Device order form faxed '[x]'$  No    '[]'$   Yes      Faxed to:  Spinal Cord Stimulator:'[x]'$  No '[]'$  Yes      (Remind patient to bring remote DOS) Other Implants:   History of Sleep Apnea? '[]'$  No '[x]'$  Yes   CPAP used?- '[]'$  No '[x]'$  Yes    Does the patient monitor blood sugar? '[x]'$  No '[]'$  Yes  '[]'$  N/A    Diet control only, no meds  Patient has: '[x]'$  Pre-DM   '[]'$  DM1  '[]'$   DM2 Does patient have a [employer] or Dexacom? '[x]'$  No '[]'$  Yes   Fasting Blood Sugar Ranges-  Checks Blood Sugar __0___ times a day  GLP1 agonist-  GLP1 instructions:  SGLT-2 inhibitors-  SGLT-2 instructions:   Other Diabetic medications/ instructions:   Blood Thinner / Instructions: Coumadin   Hold 5 days with a Lovenox bridge; to be arranged by Dr. Willey Blade.  Aspirin Instructions:none  ERAS Protocol Ordered: '[]'$  No  '[x]'$  Yes PRE-SURGERY '[]'$  ENSURE  '[x]'$  G2  '[]'$  No Drink Ordered  Patient is to be NPO after:   Comments: Vancomycin ordered  Activity level: Patient is unable to climb a flight of stairs without difficulty; '[x]'$  No CP  '[x]'$  No SOB, but would have _severe leg pain   Patient can / can not perform ADLs without assistance.   Anesthesia  review: Pre-DM, OSA-CPAP, HTN, CAD- nonobstructive, Hx DVT- Factor V Leiden  Patient denies shortness of breath, fever, cough and chest pain at PAT appointment.  Patient verbalized understanding and agreement to the Pre-Surgical Instructions that were given to them at this PAT appointment. Patient was also educated of the need to review these PAT instructions again prior to his/her surgery.I reviewed the appropriate phone numbers to call if they have any and questions or concerns.

## 2022-11-29 ENCOUNTER — Encounter (HOSPITAL_COMMUNITY)
Admission: RE | Admit: 2022-11-29 | Discharge: 2022-11-29 | Disposition: A | Discharge status: Home / Self Care | Payer: Medicare Other | Source: Ambulatory Visit | Attending: Orthopedic Surgery | Admitting: Orthopedic Surgery

## 2022-11-29 ENCOUNTER — Encounter (HOSPITAL_COMMUNITY): Payer: Self-pay

## 2022-11-29 ENCOUNTER — Ambulatory Visit: Payer: Medicare Other | Admitting: Nurse Practitioner

## 2022-11-29 ENCOUNTER — Other Ambulatory Visit: Payer: Self-pay

## 2022-11-29 VITALS — BP 122/64 | HR 82 | Temp 98.7°F | Resp 20 | Ht 69.0 in | Wt 231.0 lb

## 2022-11-29 DIAGNOSIS — G8929 Other chronic pain: Secondary | ICD-10-CM

## 2022-11-29 DIAGNOSIS — M25561 Pain in right knee: Secondary | ICD-10-CM | POA: Insufficient documentation

## 2022-11-29 DIAGNOSIS — Z01812 Encounter for preprocedural laboratory examination: Secondary | ICD-10-CM | POA: Diagnosis present

## 2022-11-29 DIAGNOSIS — I10 Essential (primary) hypertension: Secondary | ICD-10-CM

## 2022-11-29 DIAGNOSIS — Z01818 Encounter for other preprocedural examination: Secondary | ICD-10-CM

## 2022-11-29 DIAGNOSIS — R7303 Prediabetes: Secondary | ICD-10-CM | POA: Diagnosis not present

## 2022-11-29 LAB — COMPREHENSIVE METABOLIC PANEL
ALT: 33 U/L (ref 0–44)
AST: 27 U/L (ref 15–41)
Albumin: 4 g/dL (ref 3.5–5.0)
Alkaline Phosphatase: 70 U/L (ref 38–126)
Anion gap: 8 (ref 5–15)
BUN: 11 mg/dL (ref 8–23)
CO2: 25 mmol/L (ref 22–32)
Calcium: 8.5 mg/dL — ABNORMAL LOW (ref 8.9–10.3)
Chloride: 98 mmol/L (ref 98–111)
Creatinine, Ser: 0.8 mg/dL (ref 0.61–1.24)
GFR, Estimated: >60 mL/min (ref >60)
Glucose, Bld: 552 mg/dL (ref 70–99)
Potassium: 4.2 mmol/L (ref 3.5–5.1)
Sodium: 131 mmol/L — ABNORMAL LOW (ref 135–145)
Total Bilirubin: 0.8 mg/dL (ref 0.3–1.2)
Total Protein: 7.1 g/dL (ref 6.5–8.1)

## 2022-11-29 LAB — CBC WITH DIFFERENTIAL/PLATELET
Abs Immature Granulocytes: 0.03 10*3/uL (ref 0.00–0.07)
Basophils Absolute: 0 10*3/uL (ref 0.0–0.1)
Basophils Relative: 1 %
Eosinophils Absolute: 0.1 10*3/uL (ref 0.0–0.5)
Eosinophils Relative: 1 %
HCT: 43.8 % (ref 39.0–52.0)
Hemoglobin: 14.5 g/dL (ref 13.0–17.0)
Immature Granulocytes: 1 %
Lymphocytes Relative: 25 %
Lymphs Abs: 1.6 10*3/uL (ref 0.7–4.0)
MCH: 28 pg (ref 26.0–34.0)
MCHC: 33.1 g/dL (ref 30.0–36.0)
MCV: 84.6 fL (ref 80.0–100.0)
Monocytes Absolute: 0.6 10*3/uL (ref 0.1–1.0)
Monocytes Relative: 10 %
Neutro Abs: 3.9 10*3/uL (ref 1.7–7.7)
Neutrophils Relative %: 62 %
Platelets: 274 10*3/uL (ref 150–400)
RBC: 5.18 MIL/uL (ref 4.22–5.81)
RDW: 13.1 % (ref 11.5–15.5)
WBC: 6.3 10*3/uL (ref 4.0–10.5)
nRBC: 0 % (ref 0.0–0.2)

## 2022-11-29 LAB — SURGICAL PCR SCREEN
MRSA, PCR: NEGATIVE
Staphylococcus aureus: POSITIVE — AB

## 2022-11-29 LAB — GLUCOSE, CAPILLARY: Glucose-Capillary: 570 mg/dL (ref 70–99)

## 2022-11-29 NOTE — Progress Notes (Signed)
Patient's PCR screen is positive for STAPH. Appropriate notes have been placed on the patient's chart. This note has been routed to Dr.Marchwiany for review. The Patient's surgery is currently scheduled for: 12-08-2022 at [facility].  Leota Jacobsen, BSN, CVRN-BC   Pre-Surgical Testing Nurse [place]  [phone]

## 2022-11-30 LAB — HEMOGLOBIN A1C
Hgb A1c MFr Bld: 11.9 % — ABNORMAL HIGH (ref 4.8–5.6)
Mean Plasma Glucose: 295 mg/dL

## 2022-11-30 NOTE — Progress Notes (Signed)
Sent IB message to Dr. Zachery Dakins, [facility] and [facility], [place] regarding uncontrolled DM labs.

## 2022-12-08 ENCOUNTER — Encounter (HOSPITAL_COMMUNITY): Admission: RE | Payer: Self-pay | Source: Ambulatory Visit

## 2022-12-08 ENCOUNTER — Ambulatory Visit (HOSPITAL_COMMUNITY): Admission: RE | Admit: 2022-12-08 | Payer: Medicare Other | Source: Ambulatory Visit | Admitting: Orthopedic Surgery

## 2022-12-08 LAB — TYPE AND SCREEN
ABO/RH(D): A POS
Antibody Screen: NEGATIVE

## 2022-12-08 SURGERY — ARTHROPLASTY, KNEE, TOTAL
Anesthesia: Spinal | Site: Knee | Laterality: Right

## 2023-04-13 ENCOUNTER — Encounter: Payer: Self-pay | Admitting: Orthopaedic Surgery

## 2023-04-13 ENCOUNTER — Encounter: Payer: Medicare Other | Admitting: Orthopaedic Surgery

## 2023-04-13 ENCOUNTER — Ambulatory Visit (INDEPENDENT_AMBULATORY_CARE_PROVIDER_SITE_OTHER): Payer: Medicare Other | Admitting: Orthopaedic Surgery

## 2023-04-13 VITALS — Ht 69.0 in | Wt 228.0 lb

## 2023-04-13 DIAGNOSIS — M1711 Unilateral primary osteoarthritis, right knee: Secondary | ICD-10-CM

## 2023-04-13 NOTE — Progress Notes (Unsigned)
Office Visit Note   Patient: Carl [name]           Date of Birth: [birth year]           MRN: [id number] Visit Date: 04/13/2023              Requested by: Carylon Perches, MD [address] [place],  [place] [address] PCP: Carylon Perches, MD   Assessment & Plan: Visit Diagnoses:  1. Unilateral primary osteoarthritis, right knee     Plan: Patient will need cardiology preoperative visit with Dr. Simona Huh and is on Coumadin.  Plan total knee arthroplasty likely under spinal anesthesia.  He did well previously with his opposite total knee arthroplasty with home health physical therapy and outpatient PT.  Questions were asked and answered he understands and requests we proceed.  Follow-Up Instructions: No follow-ups on file.   Orders:  No orders of the defined types were placed in this encounter.  No orders of the defined types were placed in this encounter.     Procedures: No procedures performed   Clinical Data: No additional findings.   Subjective: Chief Complaint  Patient presents with   Right Knee - Pain    HPI 67 year old male with right knee osteoarthritis.  Previous left total knee arthroplasty by Dr. Madelon Lips.  Patient is here to schedule right total knee arthroplasty.  He is worked hard in his A1c is now down to 6.0 and previously 4 months ago it was 11.9.  His A1c was done on 03/04/2023.  Previous knee arthroplasty with size 6 femur size 5 tibia 41 mm patella.  Patient is interested in proceeding with surgery sometime in mid to late September after he is able to go on family vacation in August.  He has had anti-inflammatories intra-articular injections.  Patient with tricompartmental degenerative changes 10 degrees varus medial compartment bone-on-bone changes subchondral sclerosis.  Difficulty ambulating and needs used a cane.  Patient is on Coumadin 1 mg daily.  Review of Systems positive diabetes last A1c 6.0.  Controlled sleep apnea with CPAP machine.   Positive hypertension.   Objective: Vital Signs: Ht 5\' 9"  (1.753 m)   Wt 228 lb (103.4 kg)   BMI 33.67 kg/m   Physical Exam Constitutional:      Appearance: He is well-developed.  HENT:     Head: Normocephalic and atraumatic.     Right Ear: External ear normal.     Left Ear: External ear normal.  Eyes:     Pupils: Pupils are equal, round, and reactive to light.  Neck:     Thyroid: No thyromegaly.     Trachea: No tracheal deviation.  Cardiovascular:     Rate and Rhythm: Normal rate.  Pulmonary:     Effort: Pulmonary effort is normal.     Breath sounds: No wheezing.  Abdominal:     General: Bowel sounds are normal.     Palpations: Abdomen is soft.  Musculoskeletal:     Cervical back: Neck supple.  Skin:    General: Skin is warm and dry.     Capillary Refill: Capillary refill takes less than 2 seconds.  Neurological:     Mental Status: He is alert and oriented to person, place, and time.  Psychiatric:        Behavior: Behavior normal.        Thought Content: Thought content normal.        Judgment: Judgment normal.     Ortho Exam crepitus right knee  range of motion he does reach full extension flexes 110 degrees.  Collateral ligaments are stable he is in mild varus 10 degrees.  Medial compartment tenderness 2+ knee effusion.  Negative logroll hips pedal pulses are palpable.  Specialty Comments:  No specialty comments available.  Imaging: No results found.   PMFS History: Patient Active Problem List   Diagnosis Date Noted   Unilateral primary osteoarthritis, right knee 04/18/2023   Anticoagulated on Coumadin 05/27/2022   Abnormal myocardial perfusion study 09/09/2021   GERD (gastroesophageal reflux disease) 12/02/2020   NSAID induced gastritis    Esophageal dysphagia 02/06/2018   Screening for colon cancer 02/06/2018   DIABETES MELLITUS 04/22/2010   OSA on CPAP 04/22/2010   Essential hypertension 04/22/2010   Past Medical History:  Diagnosis Date    Arthritis    Cancer (HCC)    skin cancer   Carpal tunnel syndrome    Essential hypertension    Factor 5 Leiden mutation, heterozygous (HCC)    GERD (gastroesophageal reflux disease)    History of DVT (deep vein thrombosis) 2020   Left leg   History of kidney stones    Hyperlipidemia    OSA on CPAP    Pre-diabetes     Family History  Problem Relation Age of Onset   Diabetes Mother    Hypertension Father    Clotting disorder Father    COPD Father    Rheum arthritis Sister    Colon cancer Nephew        50   Colon polyps Neg Hx     Past Surgical History:  Procedure Laterality Date   ANKLE SURGERY  1996   Left   CARPAL TUNNEL RELEASE Left 04/08/2015   Procedure: LEFT CARPAL TUNNEL RELEASE;  Surgeon: Cindee Salt, MD;  Location: [facility];  Service: Orthopedics;  Laterality: Left;   CARPAL TUNNEL RELEASE Right 05/06/2015   Procedure: RIGHT CARPAL TUNNEL RELEASE;  Surgeon: Cindee Salt, MD;  Location: [facility];  Service: Orthopedics;  Laterality: Right;  ANESTHESIA:  IV REGIONAL FAB   COLONOSCOPY  02/2010   Dr. Darrick Penna: Extremely redundant and tortuous sigmoid and transverse colon.  Normal ileocecal valve, cecum could not be intubated.  Noted to have diverticulosis.  Advised to have follow-up screening colonoscopy in 5 years at [facility] overtube and fluoroscopy.   CORONARY PRESSURE/FFR STUDY N/A 09/09/2021   Procedure: INTRAVASCULAR PRESSURE WIRE/FFR STUDY;  Surgeon: Marykay Lex, MD;  Location: [facility] INVASIVE CV LAB;  Service: Cardiovascular;  Laterality: N/A;   ESOPHAGOGASTRODUODENOSCOPY N/A 03/09/2018   Fields: Benign-appearing esophageal stricture, small hiatal hernia, mild gastritis with benign biopsies.  No H. pylori.   HERNIA REPAIR     INSERTION OF MESH N/A 04/10/2014   Procedure: INSERTION OF MESH;  Surgeon: Dalia Heading, MD;  Location: [facility];  Service: General;  Laterality: N/A;   LEFT HEART CATH AND CORONARY  ANGIOGRAPHY N/A 09/09/2021   Procedure: LEFT HEART CATH AND CORONARY ANGIOGRAPHY;  Surgeon: Marykay Lex, MD;  Location[facility] INVASIVE CV LAB;  Service: Cardiovascular;  Laterality: N/A;   SAVORY DILATION N/A 03/09/2018   Procedure: SAVORY DILATION;  Surgeon: West Bali, MD;  Location: [facility];  Service: Endoscopy;  Laterality: N/A;   TOTAL KNEE ARTHROPLASTY Left 05/28/2022   Procedure: TOTAL KNEE ARTHROPLASTY;  Surgeon: Frederico Hamman, MD;  Location: [facility];  Service: Orthopedics;  Laterality: Left;   UMBILICAL HERNIA REPAIR N/A 04/10/2014   Procedure: HERNIA  REPAIR UMBILICAL ADULT;  Surgeon: Dalia Heading, MD;  Location: [facility];  Service: General;  Laterality: N/A;   Social History   Occupational History   Occupation: med Quarry manager  Tobacco Use   Smoking status: Never   Smokeless tobacco: Never  Vaping Use   Vaping status: Never Used  Substance and Sexual Activity   Alcohol use: Yes    Comment: Occasional   Drug use: No   Sexual activity: Not Currently

## 2023-04-17 NOTE — Progress Notes (Unsigned)
    Cardiology Office Note  Date: 04/18/2023   ID: Carl [name], DOB 1956/08/28, MRN [id number]  History of Present Illness: Carl [name] is a 67 y.o. male last seen in January by Ms. Philis Nettle NP, I reviewed the note.  He is here for a routine visit.  He does not report any recurrent exertional chest pain or palpitations, stable NYHA class II dyspnea.  States that he was diagnosed with type 2 diabetes mellitus by Dr. Ouida Sills, with diet has brought his hemoglobin A1c down significantly.  He is also on metformin and Ozempic.  He has lost about 10 pounds by his assessment.  He remains on long-term anticoagulation with Coumadin, followed by Dr. Ouida Sills.  No reported bleeding problems.  I reviewed his medications which are otherwise stable.  Systolic today in the 130s to 962X.  He will have his lipids checked with Dr. Ouida Sills later this year.  Physical Exam: VS:  BP (!) 146/84 (BP Location: Left Arm)   Pulse 64   Ht 5\' 9"  (1.753 m)   Wt 238 lb 6.4 oz (108.1 kg)   SpO2 98%   BMI 35.21 kg/m , BMI Body mass index is 35.21 kg/m.  Wt Readings from Last 3 Encounters:  04/18/23 238 lb 6.4 oz (108.1 kg)  04/13/23 228 lb (103.4 kg)  04/13/23 228 lb (103.4 kg)    General: Patient appears comfortable at rest. HEENT: Conjunctiva and lids normal. Neck: Supple, no elevated JVP or carotid bruits. Lungs: Clear to auscultation, nonlabored breathing at rest. Cardiac: Regular rate and rhythm, no S3 or significant systolic murmur.  ECG:  An ECG dated 10/15/2022 was personally reviewed today and demonstrated:  Sinus rhythm.  Labwork: 11/29/2022: ALT 33; AST 27; BUN 11; Creatinine, Ser 0.80; Hemoglobin 14.5; Platelets 274; Potassium 4.2; Sodium 131   Other Studies Reviewed Today:  No interval cardiac testing for review today.  Assessment and Plan:  1.  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  Overall moderate disease being managed medically.  Currently on Norvasc, Toprol-XL,  Diovan, and Ozempic with concurrent diagnosis of type 2 diabetes mellitus.  2.  Factor V Leiden mutation with history of recurrent left leg DVT, he remains on Coumadin with follow-up by Dr. Ouida Sills.  No reported spontaneous bleeding problems.  3.  Essential hypertension.  Continue present regimen and continue to track blood pressure.  Hopefully further weight loss will also be beneficial.  4.  Mixed hyperlipidemia.  He will have a lipid panel with Dr. Ouida Sills later this year, recommend resuming statin therapy if tolerated, he had previously been on Crestor.  Disposition:  Follow up  6 months.  Signed, Jonelle Sidle, M.D., F.A.C.C. [facility] at [facility]

## 2023-04-18 ENCOUNTER — Ambulatory Visit: Payer: Medicare Other | Attending: Cardiology | Admitting: Cardiology

## 2023-04-18 ENCOUNTER — Encounter: Payer: Self-pay | Admitting: Cardiology

## 2023-04-18 VITALS — BP 146/84 | HR 64 | Ht 69.0 in | Wt 238.4 lb

## 2023-04-18 DIAGNOSIS — M1711 Unilateral primary osteoarthritis, right knee: Secondary | ICD-10-CM | POA: Insufficient documentation

## 2023-04-18 DIAGNOSIS — E782 Mixed hyperlipidemia: Secondary | ICD-10-CM

## 2023-04-18 DIAGNOSIS — I10 Essential (primary) hypertension: Secondary | ICD-10-CM

## 2023-04-18 DIAGNOSIS — I25119 Atherosclerotic heart disease of native coronary artery with unspecified angina pectoris: Secondary | ICD-10-CM | POA: Diagnosis not present

## 2023-04-18 DIAGNOSIS — D6851 Activated protein C resistance: Secondary | ICD-10-CM | POA: Diagnosis not present

## 2023-04-18 NOTE — Patient Instructions (Addendum)

## 2023-04-19 NOTE — Progress Notes (Signed)
This encounter was created in error - please disregard.

## 2023-05-26 ENCOUNTER — Other Ambulatory Visit (HOSPITAL_COMMUNITY): Payer: Medicare Other

## 2023-06-01 ENCOUNTER — Ambulatory Visit: Payer: Medicare Other | Admitting: Physician Assistant

## 2023-06-02 ENCOUNTER — Telehealth: Payer: Self-pay

## 2023-06-02 NOTE — Telephone Encounter (Signed)
Cancel Surgery Received: 1 week ago [address], Carl [name] came by and needs to cancel his surgery at this time, due his back being out.

## 2023-06-10 ENCOUNTER — Ambulatory Visit: Admit: 2023-06-10 | Payer: Medicare Other | Admitting: Orthopaedic Surgery

## 2023-06-10 SURGERY — ARTHROPLASTY, KNEE, TOTAL
Anesthesia: Spinal | Site: Knee | Laterality: Right

## 2023-06-16 ENCOUNTER — Encounter: Payer: Medicare Other | Admitting: Orthopaedic Surgery

## 2023-06-16 ENCOUNTER — Ambulatory Visit (INDEPENDENT_AMBULATORY_CARE_PROVIDER_SITE_OTHER): Payer: Medicare Other | Admitting: Orthopaedic Surgery

## 2023-06-16 DIAGNOSIS — M1711 Unilateral primary osteoarthritis, right knee: Secondary | ICD-10-CM

## 2023-06-16 NOTE — Progress Notes (Signed)
Office Visit Note   Patient: Carl [name]           Date of Birth: [birth year]           MRN: [id number] Visit Date: 06/16/2023              Requested by: Carylon Perches, MD [address] [place],  [place] [address] PCP: Carylon Perches, MD   Assessment & Plan: Visit Diagnoses:  1. Unilateral primary osteoarthritis, right knee     Plan: If his back symptoms get worse he will call and we can set him up for some physical therapy.  Otherwise he can follow-up with [place] after the holiday season or sooner if his right knee gets worse.  Follow-Up Instructions: No follow-ups on file.   Orders:  No orders of the defined types were placed in this encounter.  No orders of the defined types were placed in this encounter.     Procedures: No procedures performed   Clinical Data: No additional findings.   Subjective: No chief complaint on file.   HPI 67 year old male had previous left total knee arthroplasty by me last year doing well.  He has had progressive right knee osteoarthritis but states recently his knee has been doing a little bit better he is actually having more back pain since summertime.  He thinks his back is improved some and he is wanting to wait on right total knee arthroplasty until possibly next spring.  Knee swells slightly does not think it is bad enough to consider intra-articular injection.  He denies numbness or tingling in his feet.  He has been ambulating with a limp.  Left knee has good range of motion of stability and no swelling.   Review of Systems all systems updated unchanged.   Objective: Vital Signs: There were no vitals taken for this visit.  Physical Exam Constitutional:      Appearance: He is well-developed.  HENT:     Head: Normocephalic and atraumatic.     Right Ear: External ear normal.     Left Ear: External ear normal.  Eyes:     Pupils: Pupils are equal, round, and reactive to light.  Neck:     Thyroid: No thyromegaly.      Trachea: No tracheal deviation.  Cardiovascular:     Rate and Rhythm: Normal rate.  Pulmonary:     Effort: Pulmonary effort is normal.     Breath sounds: No wheezing.  Abdominal:     General: Bowel sounds are normal.     Palpations: Abdomen is soft.  Musculoskeletal:     Cervical back: Neck supple.  Skin:    General: Skin is warm and dry.     Capillary Refill: Capillary refill takes less than 2 seconds.  Neurological:     Mental Status: He is alert and oriented to person, place, and time.  Psychiatric:        Behavior: Behavior normal.        Thought Content: Thought content normal.        Judgment: Judgment normal.     Ortho Exam patient is amatory slightly slow getting percent standing mild right knee limp.  Anterior tib gastrocsoleus is active.  Specialty Comments:  No specialty comments available.  Imaging: No results found.   PMFS History: Patient Active Problem List   Diagnosis Date Noted   Unilateral primary osteoarthritis, right knee 04/18/2023   Anticoagulated on Coumadin 05/27/2022   Abnormal myocardial perfusion study 09/09/2021  GERD (gastroesophageal reflux disease) 12/02/2020   NSAID induced gastritis    Esophageal dysphagia 02/06/2018   Screening for colon cancer 02/06/2018   DIABETES MELLITUS 04/22/2010   OSA on CPAP 04/22/2010   Essential hypertension 04/22/2010   Past Medical History:  Diagnosis Date   Arthritis    Cancer (HCC)    skin cancer   Carpal tunnel syndrome    Essential hypertension    Factor 5 Leiden mutation, heterozygous (HCC)    GERD (gastroesophageal reflux disease)    History of DVT (deep vein thrombosis) 2020   Left leg   History of kidney stones    Hyperlipidemia    OSA on CPAP    Pre-diabetes     Family History  Problem Relation Age of Onset   Diabetes Mother    Hypertension Father    Clotting disorder Father    COPD Father    Rheum arthritis Sister    Colon cancer Nephew        50   Colon polyps Neg Hx      Past Surgical History:  Procedure Laterality Date   ANKLE SURGERY  1996   Left   CARPAL TUNNEL RELEASE Left 04/08/2015   Procedure: LEFT CARPAL TUNNEL RELEASE;  Surgeon: Cindee Salt, MD;  Location: [facility];  Service: Orthopedics;  Laterality: Left;   CARPAL TUNNEL RELEASE Right 05/06/2015   Procedure: RIGHT CARPAL TUNNEL RELEASE;  Surgeon: Cindee Salt, MD;  Location: [facility];  Service: Orthopedics;  Laterality: Right;  ANESTHESIA:  IV REGIONAL FAB   COLONOSCOPY  02/2010   Dr. Darrick Penna: Extremely redundant and tortuous sigmoid and transverse colon.  Normal ileocecal valve, cecum could not be intubated.  Noted to have diverticulosis.  Advised to have follow-up screening colonoscopy in 5 years at [facility] overtube and fluoroscopy.   CORONARY PRESSURE/FFR STUDY N/A 09/09/2021   Procedure: INTRAVASCULAR PRESSURE WIRE/FFR STUDY;  Surgeon: Marykay Lex, MD;  Location: [facility]AS[facility] CV [facility];  Service: Cardiovascular;  Laterality: N/A;   ESOPHAGOGASTRODUODENOSCOPY N/A 03/09/2018   Fields: Benign-appearing esophageal stricture, small hiatal hernia, mild gastritis with benign biopsies.  No H. pylori.   HERNIA REPAIR     INSERTION OF MESH N/A 04/10/2014   Procedure: INSERTION OF MESH;  Surgeon: Dalia Heading, MD;  Location: [facility];  Service: General;  Laterality: N/A;   LEFT HEART CATH AND CORONARY ANGIOGRAPHY N/A 09/09/2021   Procedure: LEFT HEART CATH AND CORONARY ANGIOGRAPHY;  Surgeon: Marykay Lex, MD;  Location: [facility];  Service: Cardiovascular;  Laterality: N/A;   SAVORY DILATION N/A 03/09/2018   Procedure: SAVORY DILATION;  Surgeon: West Bali, MD;  Location: [facility];  Service: Endoscopy;  Laterality: N/A;   TOTAL KNEE ARTHROPLASTY Left 05/28/2022   Procedure: TOTAL KNEE ARTHROPLASTY;  Surgeon: Frederico Hamman, MD;  Location: [facility];  Service: Orthopedics;  Laterality: Left;   UMBILICAL HERNIA REPAIR N/A  04/10/2014   Procedure: HERNIA REPAIR UMBILICAL ADULT;  Surgeon: Dalia Heading, MD;  Location: [facility];  Service: General;  Laterality: N/A;   Social History   Occupational History   Occupation: med Quarry manager  Tobacco Use   Smoking status: Never   Smokeless tobacco: Never  Vaping Use   Vaping status: Never Used  Substance and Sexual Activity   Alcohol use: Yes    Comment: Occasional   Drug use: No   Sexual activity: Not Currently

## 2023-10-17 ENCOUNTER — Encounter: Payer: Self-pay | Admitting: Cardiology

## 2023-10-17 ENCOUNTER — Ambulatory Visit: Payer: Medicare Other | Attending: Cardiology | Admitting: Cardiology

## 2023-10-17 VITALS — BP 114/68 | HR 77 | Ht 69.0 in | Wt 245.4 lb

## 2023-10-17 DIAGNOSIS — D6851 Activated protein C resistance: Secondary | ICD-10-CM | POA: Diagnosis not present

## 2023-10-17 DIAGNOSIS — I10 Essential (primary) hypertension: Secondary | ICD-10-CM | POA: Diagnosis not present

## 2023-10-17 DIAGNOSIS — E782 Mixed hyperlipidemia: Secondary | ICD-10-CM | POA: Diagnosis not present

## 2023-10-17 DIAGNOSIS — I25119 Atherosclerotic heart disease of native coronary artery with unspecified angina pectoris: Secondary | ICD-10-CM | POA: Diagnosis not present

## 2023-10-17 NOTE — Progress Notes (Signed)
    Cardiology Office Note  Date: 10/17/2023   ID: ITAI BUFALINI, DOB 1955-11-30, MRN [id number]  History of Present Illness: Carl [name] is a 68 y.o. male last seen in July 2024.  He is here for a follow-up visit.  Reports no angina, stable NYHA class II dyspnea, no palpitations or dizziness.  He continues on Coumadin with follow-up by Dr. Ouida Sills.  I reviewed the remainder of his cardiac medications which include Norvasc and Toprol-XL.  He also remains on Ozempic.  He does not report any spontaneous bleeding problems.  States that he will have lab work in February for a physical with Dr. Ouida Sills.  I reviewed his ECG today which shows sinus rhythm with nonspecific ST changes.  Physical Exam: VS:  BP 114/68   Pulse 77   Ht 5\' 9"  (1.753 m)   Wt 245 lb 6.4 oz (111.3 kg)   SpO2 98%   BMI 36.24 kg/m , BMI Body mass index is 36.24 kg/m.  Wt Readings from Last 3 Encounters:  10/17/23 245 lb 6.4 oz (111.3 kg)  04/18/23 238 lb 6.4 oz (108.1 kg)  04/13/23 228 lb (103.4 kg)    General: Patient appears comfortable at rest. HEENT: Conjunctiva and lids normal. Neck: Supple, no elevated JVP or carotid bruits. Lungs: Clear to auscultation, nonlabored breathing at rest. Cardiac: Regular rate and rhythm, no S3 or significant systolic murmur. Extremities: No pitting edema.  ECG:  An ECG dated 10/15/2022 was personally reviewed today and demonstrated:  Sinus rhythm.  Labwork: 11/29/2022: ALT 33; AST 27; BUN 11; Creatinine, Ser 0.80; Hemoglobin 14.5; Platelets 274; Potassium 4.2; Sodium 131  October 2024: Hemoglobin A1c 5.8%  Other Studies Reviewed Today:  No interval cardiac testing for review today.  Assessment and Plan:  1.  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  Overall moderate disease being managed medically.  He does not report any angina, ECG reviewed.  Not on aspirin given use of Coumadin.  He otherwise continues on Ozempic.   2.  Factor V Leiden  mutation with history of recurrent left leg DVT, he remains on Coumadin with follow-up by Dr. Ouida Sills.  No spontaneous bleeding problems reported.   3.  Primary hypertension.  Blood pressure is well-controlled today.  Continue Norvasc and Toprol-XL.   4.  Mixed hyperlipidemia.  FLP in December 2022 showed LDL 70, HDL 43.  Not on therapy at this time.  Follow-up lab work with Dr. Ouida Sills and consider at least low-dose statin therapy.  Disposition:  Follow up  6 months.  Signed, Jonelle Sidle, M.D., F.A.C.C. [facility] at [facility]

## 2023-10-17 NOTE — Patient Instructions (Signed)
Medication Instructions:  Continue all current medications.   Labwork: none  Testing/Procedures: none  Follow-Up: 6 months   Any Other Special Instructions Will Be Listed Below (If Applicable).   If you need a refill on your cardiac medications before your next appointment, please call your pharmacy.  

## 2024-03-16 ENCOUNTER — Telehealth: Payer: Self-pay

## 2024-03-16 NOTE — Telephone Encounter (Signed)
   Pre-operative Risk Assessment    Patient Name: Carl [name]  DOB: 04-23-1956 MRN: [id number]   Date of last office visit: 10/17/23 Teddie Favre, MD Date of next office visit: NONE   Request for Surgical Clearance    Procedure:  RIGHT TOTAL KNEE REPLACEMENT  Date of Surgery:  Clearance TBD                                Surgeon:  DR Broadus Canes Surgeon's Group or Practice Name:  [facility] Phone number:  [phone]  EXT 3134 Fax number:  [phone]   ATTN: KELLY HIGH   Type of Clearance Requested:   - Medical  - Pharmacy:  Hold Warfarin (Coumadin)     Type of Anesthesia:  Spinal   Additional requests/questions:    SignedCollin Deal   03/16/2024, 9:12 AM

## 2024-03-16 NOTE — Telephone Encounter (Signed)
 S/W pt and scheduled TELE Preop appt for 03/23/24. Med rec and consent done   Will update surgeons office.

## 2024-03-16 NOTE — Telephone Encounter (Signed)
   Name: Carl [name]  DOB: 03-05-1956  MRN: [id number]  Primary Cardiologist: Teddie Favre, MD   Preoperative team, please contact this patient and set up a phone call appointment for further preoperative risk assessment. Please obtain consent and complete medication review. Thank you for your help. Last saw Dr. Londa Rival on 10/17/2023  I confirm that guidance regarding antiplatelet and oral anticoagulation therapy has been completed and, if necessary, noted below.  Coumadin is followed by Dr. Hildy Lowers. Please contact his office for recommendations.   I also confirmed the patient resides in the state of [place] . As per [facility] Medical Board telemedicine laws, the patient must reside in the state in which the provider is licensed.   Friddie Jetty, NP 03/16/2024, 9:32 AM [facility]

## 2024-03-16 NOTE — Telephone Encounter (Signed)
 Med rec and consent done     Patient Consent for Virtual Visit        Carl [name] has provided verbal consent on 03/16/2024 for a virtual visit (video or telephone).   CONSENT FOR VIRTUAL VISIT FOR:  Carl [name]  By participating in this virtual visit I agree to the following:  I hereby voluntarily request, consent and authorize [facility] and its employed or contracted physicians, physician assistants, nurse practitioners or other licensed health care professionals (the Practitioner), to provide me with telemedicine health care services (the "Services) as deemed necessary by the treating Practitioner. I acknowledge and consent to receive the Services by the Practitioner via telemedicine. I understand that the telemedicine visit will involve communicating with the Practitioner through live audiovisual communication technology and the disclosure of certain medical information by electronic transmission. I acknowledge that I have been given the opportunity to request an in-person assessment or other available alternative prior to the telemedicine visit and am voluntarily participating in the telemedicine visit.  I understand that I have the right to withhold or withdraw my consent to the use of telemedicine in the course of my care at any time, without affecting my right to future care or treatment, and that the Practitioner or I may terminate the telemedicine visit at any time. I understand that I have the right to inspect all information obtained and/or recorded in the course of the telemedicine visit and may receive copies of available information for a reasonable fee.  I understand that some of the potential risks of receiving the Services via telemedicine include:  Delay or interruption in medical evaluation due to technological equipment failure or disruption; Information transmitted may not be sufficient (e.g. poor resolution of images) to allow for appropriate medical  decision making by the Practitioner; and/or  In rare instances, security protocols could fail, causing a breach of personal health information.  Furthermore, I acknowledge that it is my responsibility to provide information about my medical history, conditions and care that is complete and accurate to the best of my ability. I acknowledge that Practitioner's advice, recommendations, and/or decision may be based on factors not within their control, such as incomplete or inaccurate data provided by me or distortions of diagnostic images or specimens that may result from electronic transmissions. I understand that the practice of medicine is not an exact science and that Practitioner makes no warranties or guarantees regarding treatment outcomes. I acknowledge that a copy of this consent can be made available to me via my patient portal [facility] MyChart), or I can request a printed copy by calling the office of [facility].    I understand that my insurance will be billed for this visit.   I have read or had this consent read to me. I understand the contents of this consent, which adequately explains the benefits and risks of the Services being provided via telemedicine.  I have been provided ample opportunity to ask questions regarding this consent and the Services and have had my questions answered to my satisfaction. I give my informed consent for the services to be provided through the use of telemedicine in my medical care

## 2024-03-16 NOTE — Telephone Encounter (Signed)
 Left message to call back to schedule tele pre op appt.

## 2024-03-23 ENCOUNTER — Ambulatory Visit: Attending: Cardiology

## 2024-03-23 DIAGNOSIS — Z01810 Encounter for preprocedural cardiovascular examination: Secondary | ICD-10-CM

## 2024-03-23 NOTE — Progress Notes (Signed)
 Virtual Visit via Telephone Note   Because of Ethyn R Akopyan co-morbid illnesses, he is at least at moderate risk for complications without adequate follow up.  This format is felt to be most appropriate for this patient at this time.  Due to technical limitations with video connection (technology), today's appointment will be conducted as an audio only telehealth visit, and Carl [name] verbally agreed to proceed in this manner.   All issues noted in this document were discussed and addressed.  No physical exam could be performed with this format.  Evaluation Performed:  Preoperative cardiovascular risk assessment _____________   Date:  03/23/2024   Patient ID:  Carl [name], DOB 07-21-1956, MRN [id number] Patient Location:  Home Provider location:   Office  Primary Care Provider:  Sheryle Carwin, MD Primary Cardiologist:  Jayson Sierras, MD  Chief Complaint / Patient Profile  68 y.o. y/o male with a h/o nonobstructive CAD by cardiac catheterization in 08/2021, factor V Leiden mutation with history of recurrent left leg DVT, hypertension, hyperlipidemia who is pending right total knee replacement and presents today for telephonic preoperative cardiovascular risk assessment. History of Present Illness  Carl [name] is a 68 y.o. male who presents via audio/video conferencing for a telehealth visit today.  Pt was last seen in cardiology clinic on 10/17/2023 by Dr. Sierras.  At that time Carl [name] was doing well.  The patient is now pending procedure as outlined above. Since his last visit, he has remained stable from a cardiac standpoint.  Patient is able to achieve greater than 4 METS of activity, notes that he regularly goes hunting in the woods with his dogs and is able to walk longer distances, is also able to complete household chores and ADLs. Today he denies chest pain, shortness of breath, lower extremity edema, fatigue, palpitations, melena, hematuria, hemoptysis,  diaphoresis, weakness, presyncope, syncope, orthopnea, and PND.  Past Medical History    Past Medical History:  Diagnosis Date   Arthritis    Cancer (HCC)    skin cancer   Carpal tunnel syndrome    Essential hypertension    Factor 5 Leiden mutation, heterozygous (HCC)    GERD (gastroesophageal reflux disease)    History of DVT (deep vein thrombosis) 2020   Left leg   History of kidney stones    Hyperlipidemia    OSA on CPAP    Pre-diabetes    Past Surgical History:  Procedure Laterality Date   ANKLE SURGERY  1996   Left   CARPAL TUNNEL RELEASE Left 04/08/2015   Procedure: LEFT CARPAL TUNNEL RELEASE;  Surgeon: Arley Curia, MD;  Location: [facility];  Service: Orthopedics;  Laterality: Left;   CARPAL TUNNEL RELEASE Right 05/06/2015   Procedure: RIGHT CARPAL TUNNEL RELEASE;  Surgeon: Arley Curia, MD;  Location: [facility];  Service: Orthopedics;  Laterality: Right;  ANESTHESIA:  IV [facility] FAB   COLONOSCOPY  02/2010   Dr. harvey: Extremely redundant and tortuous sigmoid and transverse colon.  Normal ileocecal valve, cecum could not be intubated.  Noted to have diverticulosis.  Advised to have follow-up screening colonoscopy in 5 years at [facility] overtube and fluoroscopy.   CORONARY PRESSURE/FFR STUDY N/A 09/09/2021   Procedure: INTRAVASCULAR PRESSURE WIRE/FFR STUDY;  Surgeon: Anner Alm ORN, MD;  Location: [facility] INVASIVE CV LAB;  Service: Cardiovascular;  Laterality: N/A;   ESOPHAGOGASTRODUODENOSCOPY N/A 03/09/2018   Fields: Benign-appearing esophageal stricture, small hiatal hernia, mild gastritis with  benign biopsies.  No H. pylori.   HERNIA REPAIR     INSERTION OF MESH N/A 04/10/2014   Procedure: INSERTION OF MESH;  Surgeon: Oneil DELENA Budge, MD;  Location: [facility];  Service: General;  Laterality: N/A;   LEFT HEART CATH AND CORONARY ANGIOGRAPHY N/A 09/09/2021   Procedure: LEFT HEART CATH AND CORONARY ANGIOGRAPHY;  Surgeon:  Anner Alm ORN, MD;  Location: [facility] INVASIVE CV LAB;  Service: Cardiovascular;  Laterality: N/A;   SAVORY DILATION N/A 03/09/2018   Procedure: SAVORY DILATION;  Surgeon: Harvey Margo CROME, MD;  Location: [facility];  Service: Endoscopy;  Laterality: N/A;   TOTAL KNEE ARTHROPLASTY Left 05/28/2022   Procedure: TOTAL KNEE ARTHROPLASTY;  Surgeon: Shari Sieving, MD;  Location: [facility];  Service: Orthopedics;  Laterality: Left;   UMBILICAL HERNIA REPAIR N/A 04/10/2014   Procedure: HERNIA REPAIR UMBILICAL ADULT;  Surgeon: Oneil DELENA Budge, MD;  Location: [facility];  Service: General;  Laterality: N/A;    Allergies  Allergies  Allergen Reactions   Amoxicillin Other (See Comments)    Joints ache and can't move Has patient had a PCN reaction causing immediate rash, facial/tongue/throat swelling, SOB or lightheadedness with hypotension: No Has patient had a PCN reaction causing severe rash involving mucus membranes or skin necrosis: No Has patient had a PCN reaction that required hospitalization: No Has patient had a PCN reaction occurring within the last 10 years: Yes If all of the above answers are NO, then may proceed with Cephalosporin use.     Other Diarrhea    Alfredo sauce     Home Medications    Prior to Admission medications   Medication Sig Start Date End Date Taking? Authorizing Provider  acetaminophen  (TYLENOL ) 500 MG tablet Take 500-1,000 mg by mouth every 6 (six) hours as needed for moderate pain or headache.    [provider]  amLODipine (NORVASC) 10 MG tablet Take 10 mg by mouth daily. 12/02/20   [provider]  Ascorbic Acid (VITAMIN C) 1000 MG tablet Take 1,000 mg by mouth at bedtime.    [provider]  Cholecalciferol (VITAMIN D) 50 MCG (2000 UT) tablet Take 2,000 Units by mouth at bedtime.    [provider]  escitalopram (LEXAPRO) 10 MG tablet Take 10 mg by mouth at bedtime. 06/05/20   [provider]  metoprolol  succinate  (TOPROL -XL) 25 MG 24 hr tablet TAKE 1/2 TABLET BY MOUTH EVERY DAY 02/17/22   McDowell, Samuel G, MD  oxymetazoline (AFRIN) 0.05 % nasal spray Place 2 sprays into both nostrils at bedtime.     [provider]  OZEMPIC, 2 MG/DOSE, 8 MG/3ML SOPN Inject 2 mg into the skin once a week. 03/25/23   [provider]  warfarin (COUMADIN) 3 MG tablet Take 3 mg by mouth daily. Take with 1 mg to equal 4 mg daily    [provider]    Physical Exam    Vital Signs:  Carl [name] does not have vital signs available for review today.  Given telephonic nature of communication, physical exam is limited. AAOx3. NAD. Normal affect.  Speech and respirations are unlabored.  Accessory Clinical Findings    None  Assessment & Plan    1.  Preoperative Cardiovascular Risk Assessment: Carl [name]'s perioperative risk of a major cardiac event is 0.4% according to the Revised Cardiac Risk Index (RCRI).  Therefore, he is at low risk for perioperative complications.   His functional capacity is good at 7.99 METs according  to the [facility] Activity Status Index (DASI). Recommendations: According to ACC/AHA guidelines, no further cardiovascular testing needed.  The patient may proceed to surgery at acceptable risk.   Antiplatelet and/or Anticoagulation Recommendations: Coumadin is followed by [name], please contact his office for recommendations regarding holding of Coumadin.   The patient was advised that if he develops new symptoms prior to surgery to contact our office to arrange for a follow-up visit, and he verbalized understanding.  A copy of this note will be routed to requesting surgeon.  Time:   Today, I have spent 10 minutes with the patient with telehealth technology discussing medical history, symptoms, and management plan.    Josy Peaden D Jolean Madariaga, NP  03/23/2024, 9:17 AM

## 2024-04-18 NOTE — Patient Instructions (Addendum)
 SURGICAL WAITING ROOM VISITATION  Patients having surgery or a procedure may have no more than 2 support people in the waiting area - these visitors may rotate.    Children under the age of 7 must have an adult with them who is not the patient.  Visitors with respiratory illnesses are discouraged from visiting and should remain at home.  If the patient needs to stay at the hospital during part of their recovery, the visitor guidelines for inpatient rooms apply. Pre-op nurse will coordinate an appropriate time for 1 support person to accompany patient in pre-op.  This support person may not rotate.    Please refer to the [facility] website for the visitor guidelines for Inpatients (after your surgery is over and you are in a regular room).       Your procedure is scheduled on:   05/01/2024    Report to [facility] Main Entrance    Report to admitting at   [phone]   Call this number if you have problems the morning of surgery [phone]   Do not eat food :After Midnight.   After Midnight you may have the following liquids until _ 0415_____ AM DAY OF SURGERY  Water  Non-Citrus Juices (without pulp, NO RED-Apple, White grape, White cranberry) Black Coffee (NO MILK/CREAM OR CREAMERS, sugar ok)  Clear Tea (NO MILK/CREAM OR CREAMERS, sugar ok) regular and decaf                             Plain Jell-O (NO RED)                                           Fruit ices (not with fruit pulp, NO RED)                                     Popsicles (NO RED)                                                               Sports drinks like Gatorade (NO RED)                    The day of surgery:  Drink ONE (1) Pre-Surgery Clear Ensure or G2 at  0415AM the morning of surgery. Drink in one sitting. Do not sip.  This drink was given to you during your hospital  pre-op appointment visit. Nothing else to drink after completing the  Pre-Surgery Clear Ensure or G2.          If you have  questions, please contact your surgeon's office.       Oral Hygiene is also important to reduce your risk of infection.                                    Remember - BRUSH YOUR TEETH THE MORNING OF SURGERY WITH YOUR REGULAR TOOTHPASTE  DENTURES WILL BE REMOVED PRIOR TO SURGERY PLEASE DO NOT APPLY Poly grip OR  ADHESIVES!!!   Do NOT smoke after Midnight   Stop all vitamins and herbal supplements 7 days before surgery.   Take these medicines the morning of surgery with A SIP OF WATER :  amlodipoine, toprol , protonix               Ozempioc- last dose on   DO NOT TAKE ANY ORAL DIABETIC MEDICATIONS DAY OF YOUR SURGERY  Bring CPAP mask and tubing day of surgery.                              You may not have any metal on your body including hair pins, jewelry, and body piercing             Do not wear make-up, lotions, powders, perfumes/cologne, or deodorant  Do not wear nail polish including gel and S&S, artificial/acrylic nails, or any other type of covering on natural nails including finger and toenails. If you have artificial nails, gel coating, etc. that needs to be removed by a nail salon please have this removed prior to surgery or surgery may need to be canceled/ delayed if the surgeon/ anesthesia feels like they are unable to be safely monitored.   Do not shave  48 hours prior to surgery.               Men may shave face and neck.   Do not bring valuables to the hospital. [facility] IS NOT             RESPONSIBLE   FOR VALUABLES.   Contacts, glasses, dentures or bridgework may not be worn into surgery.   Bring small overnight bag day of surgery.   DO NOT BRING YOUR HOME MEDICATIONS TO THE HOSPITAL. PHARMACY WILL DISPENSE MEDICATIONS LISTED ON YOUR MEDICATION LIST TO YOU DURING YOUR ADMISSION IN THE HOSPITAL!    Patients discharged on the day of surgery will not be allowed to drive home.  Someone NEEDS to stay with you for the first 24 hours after anesthesia.   Special  Instructions: Bring a copy of your healthcare power of attorney and living will documents the day of surgery if you haven't scanned them before.              Please read over the following fact sheets you were given: IF YOU HAVE QUESTIONS ABOUT YOUR PRE-OP INSTRUCTIONS PLEASE CALL [phone].   If you received a COVID test during your pre-op visit  it is requested that you wear a mask when out in public, stay away from anyone that may not be feeling well and notify your surgeon if you develop symptoms. If you test positive for Covid or have been in contact with anyone that has tested positive in the last 10 days please notify you surgeon.      Pre-operative 5 CHG Bath Instructions   You can play a key role in reducing the risk of infection after surgery. Your skin needs to be as free of germs as possible. You can reduce the number of germs on your skin by washing with CHG (chlorhexidine  gluconate) soap before surgery. CHG is an antiseptic soap that kills germs and continues to kill germs even after washing.   DO NOT use if you have an allergy to chlorhexidine /CHG or antibacterial soaps. If your skin becomes reddened or irritated, stop using the CHG and notify one of our RNs at [phone].   Please shower with the CHG soap  starting 4 days before surgery using the following schedule:     Please keep in mind the following:  DO NOT shave, including legs and underarms, starting the day of your first shower.   You may shave your face at any point before/day of surgery.  Place clean sheets on your bed the day you start using CHG soap. Use a clean washcloth (not used since being washed) for each shower. DO NOT sleep with pets once you start using the CHG.   CHG Shower Instructions:  If you choose to wash your hair and private area, wash first with your normal shampoo/soap.  After you use shampoo/soap, rinse your hair and body thoroughly to remove shampoo/soap residue.  Turn the water  OFF and  apply about 3 tablespoons (45 ml) of CHG soap to a CLEAN washcloth.  Apply CHG soap ONLY FROM YOUR NECK DOWN TO YOUR TOES (washing for 3-5 minutes)  DO NOT use CHG soap on face, private areas, open wounds, or sores.  Pay special attention to the area where your surgery is being performed.  If you are having back surgery, having someone wash your back for you may be helpful. Wait 2 minutes after CHG soap is applied, then you may rinse off the CHG soap.  Pat dry with a clean towel  Put on clean clothes/pajamas   If you choose to wear lotion, please use ONLY the CHG-compatible lotions on the back of this paper.     Additional instructions for the day of surgery: DO NOT APPLY any lotions, deodorants, cologne, or perfumes.   Put on clean/comfortable clothes.  Brush your teeth.  Ask your nurse before applying any prescription medications to the skin.      CHG Compatible Lotions   Aveeno Moisturizing lotion  Cetaphil Moisturizing Cream  Cetaphil Moisturizing Lotion  Clairol Herbal Essence Moisturizing Lotion, Dry Skin  Clairol Herbal Essence Moisturizing Lotion, Extra Dry Skin  Clairol Herbal Essence Moisturizing Lotion, Normal Skin  Curel Age Defying Therapeutic Moisturizing Lotion with Alpha Hydroxy  Curel Extreme Care Body Lotion  Curel Soothing Hands Moisturizing Hand Lotion  Curel Therapeutic Moisturizing Cream, Fragrance-Free  Curel Therapeutic Moisturizing Lotion, Fragrance-Free  Curel Therapeutic Moisturizing Lotion, Original Formula  Eucerin Daily Replenishing Lotion  Eucerin Dry Skin Therapy Plus Alpha Hydroxy Crme  Eucerin Dry Skin Therapy Plus Alpha Hydroxy Lotion  Eucerin Original Crme  Eucerin Original Lotion  Eucerin Plus Crme Eucerin Plus Lotion  Eucerin TriLipid Replenishing Lotion  Keri Anti-Bacterial Hand Lotion  Keri Deep Conditioning Original Lotion Dry Skin Formula Softly Scented  Keri Deep Conditioning Original Lotion, Fragrance Free Sensitive Skin  Formula  Keri Lotion Fast Absorbing Fragrance Free Sensitive Skin Formula  Keri Lotion Fast Absorbing Softly Scented Dry Skin Formula  Keri Original Lotion  Keri Skin Renewal Lotion Keri Silky Smooth Lotion  Keri Silky Smooth Sensitive Skin Lotion  Nivea Body Creamy Conditioning Oil  Nivea Body Extra Enriched Teacher, adult education Moisturizing Lotion Nivea Crme  Nivea Skin Firming Lotion  NutraDerm 30 Skin Lotion  NutraDerm Skin Lotion  NutraDerm Therapeutic Skin Cream  NutraDerm Therapeutic Skin Lotion  ProShield Protective Hand Cream  Provon moisturizing lotion

## 2024-04-18 NOTE — Progress Notes (Addendum)
 Anesthesia Review:  PCP: Gaither Langton  Cardiologist : Sam Mcdowell LOV 10/17/23 Clearance 03/19/24 Telephone visit   PPM/ ICD: Device Orders: Rep Notified:  Chest x-ray : EKG : 10/17/2023  Echo : 2022  Stress test: 2022  Cardiac Cath : 2022    Activity level: can do a flight of stairs without difficutly  Sleep Study/ CPAP : has cpap  Fasting Blood Sugar :      / Checks Blood Sugar -- times a day:     Prediabetes  Ozempic- last dose on 04/21/24.   Blood Thinner/ Instructions /Last Dose: ASA / Instructions/ Last Dose :    Hx of Leiden Factor V- followed by DR Langton   Coumadin - last dose on 04/26/24  Pt reports at preop on 04/19/24 he received preop instructions for Lovenox bridge. PT to start Lovenox on 04/27/2024 per pt .  PT to call preop nurse backl with further instructions for Lovenox because he has not picked up as of 04/19/24.  PT/INR ordered for DOS.     Called pt on 04/23/24 and he stated that pharmacy had just called him to tell him Lovenox was ready to be picked up.  Pt states he will not know instructions untl he picks up.   On 04/23/24 Waddell Essex, [employer] Tech called [facility] and they told her it was 150mg  for Lovenox once daily started 04/27/24-04/30/2024 then resume after surgery.

## 2024-04-19 ENCOUNTER — Encounter (HOSPITAL_COMMUNITY)
Admission: RE | Admit: 2024-04-19 | Discharge: 2024-04-19 | Disposition: A | Discharge status: Home / Self Care | Source: Ambulatory Visit | Attending: Orthopedic Surgery | Admitting: Orthopedic Surgery

## 2024-04-19 ENCOUNTER — Other Ambulatory Visit: Payer: Self-pay

## 2024-04-19 ENCOUNTER — Encounter (HOSPITAL_COMMUNITY): Payer: Self-pay

## 2024-04-19 VITALS — BP 144/90 | HR 73 | Temp 98.5°F | Resp 16 | Ht 68.0 in | Wt 214.0 lb

## 2024-04-19 DIAGNOSIS — Z01818 Encounter for other preprocedural examination: Secondary | ICD-10-CM | POA: Diagnosis present

## 2024-04-19 LAB — SURGICAL PCR SCREEN
MRSA, PCR: NEGATIVE
Staphylococcus aureus: POSITIVE — AB

## 2024-04-19 LAB — BASIC METABOLIC PANEL WITH GFR
Anion gap: 8 (ref 5–15)
BUN: 7 mg/dL — ABNORMAL LOW (ref 8–23)
CO2: 25 mmol/L (ref 22–32)
Calcium: 9.1 mg/dL (ref 8.9–10.3)
Chloride: 104 mmol/L (ref 98–111)
Creatinine, Ser: 0.68 mg/dL (ref 0.61–1.24)
GFR, Estimated: >60 mL/min (ref >60)
Glucose, Bld: 102 mg/dL — ABNORMAL HIGH (ref 70–99)
Potassium: 3.8 mmol/L (ref 3.5–5.1)
Sodium: 137 mmol/L (ref 135–145)

## 2024-04-19 LAB — CBC
HCT: 47.9 % (ref 39.0–52.0)
Hemoglobin: 15.6 g/dL (ref 13.0–17.0)
MCH: 27.9 pg (ref 26.0–34.0)
MCHC: 32.6 g/dL (ref 30.0–36.0)
MCV: 85.7 fL (ref 80.0–100.0)
Platelets: 278 K/uL (ref 150–400)
RBC: 5.59 MIL/uL (ref 4.22–5.81)
RDW: 12.7 % (ref 11.5–15.5)
WBC: 7.8 K/uL (ref 4.0–10.5)
nRBC: 0 % (ref 0.0–0.2)

## 2024-04-23 ENCOUNTER — Encounter (HOSPITAL_COMMUNITY): Payer: Self-pay

## 2024-04-23 NOTE — Progress Notes (Signed)
 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  Noted to have difficult airway during that time and Glidescope was used.  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 Future Recommendations: Recommend- induction with short-acting agent, and alternative techniques readily available.  Patient follows with cardiology for nonobstructive CAD by cath in 2022.  Last seen in clinic on 10/17/2023 by Dr. Debera.  Noted to be stable.  Moderate disease is being managed medically.  Advise follow-up in 6 months.  He had a cardiac clearance televisit on 03/23/2024 and was cleared for surgery:  Preoperative Cardiovascular Risk Assessment: Carl [name]'s perioperative risk of a major cardiac event is 0.4% according to the Revised Cardiac Risk Index (RCRI).  Therefore, he is at low risk for perioperative complications.   His functional capacity is good at 7.99 METs according to the Duke Activity Status Index (DASI). Recommendations: According to ACC/AHA guidelines, no further cardiovascular testing needed.  The patient may proceed to surgery at acceptable risk.   Antiplatelet and/or Anticoagulation Recommendations: Coumadin  is followed by [name], please contact his office for recommendations regarding holding of Coumadin .  Seen by PCP on 02/07/2024 for routine follow-up.  Diabetes has been well-controlled.  Blood pressure has been controlled.  INR within range.  PCP will be arranging a  Lovenox bridge.  Per [name]: He does not appear to have any medical contraindications to proceeding with knee replacement   LD Warfarin: 7/31 and then patient will start Lovenox bridge. He reports he has instructions and will start 8/1. PT/INR ordered for DOS.    LD Ozempic: 7/26  VS: BP (!) 144/90   Pulse 73   Temp 36.9 C (Oral)   Resp 16   Ht 5' 8 (1.727 m)   Wt 97.1 kg   SpO2 97%   BMI 32.54 kg/m   PROVIDERS: Sheryle Carwin, MD   LABS: Labs reviewed: Acceptable for surgery. (all labs ordered are listed, but only abnormal results are displayed)  Labs Reviewed  SURGICAL PCR SCREEN - Abnormal; Notable for the following components:      Result Value   Staphylococcus aureus POSITIVE (*)    All other components within normal limits  BASIC METABOLIC PANEL WITH GFR - Abnormal; Notable for the following components:   Glucose, Bld 102 (*)    BUN 7 (*)    All other components within normal limits  CBC     IMAGES:   EKG 10/17/2023:  Normal sinus rhythm, rate 77 Non-specific ST-t changes   CV:  Left heart cath 09/09/2021:    Mid RCA lesion is 55% stenosed.  RFR 0.97   Mid LAD to Dist LAD lesion is 40% stenosed.   The left ventricular systolic function is normal.   LV end diastolic pressure is normal.   The left ventricular ejection fraction is 55-65% by visual estimate.   There is no aortic valve stenosis.   SUMMARY Angiographically moderate mid RCA & LAD disease, with no focal lesion  to explain symptoms.  Overall large caliber vessels. RFR negative mid RCA segmental lesion (RFR 0.97) Normal LV function with a normal EDP      RECOMMENDATIONS Consider other nonischemic etiologies for concerning symptoms. Discharged home today after bedrest somewhat delayed because of x-ray have administered.  Echo 08/31/2021:  IMPRESSIONS    1. Left ventricular ejection fraction, by estimation, is 70 to 75%. The left ventricle has hyperdynamic function. The left ventricle  has no regional wall motion abnormalities. There is moderate left ventricular hypertrophy. Left ventricular diastolic parameters are indeterminate.  2. Right ventricular systolic function is normal. The right ventricular size is normal. Tricuspid regurgitation signal is inadequate for assessing PA pressure.  3. The mitral valve is grossly normal. Trivial mitral valve regurgitation.  4. The aortic valve is tricuspid. Aortic valve regurgitation is not visualized. Aortic valve sclerosis is present, with no evidence of aortic valve stenosis.  5. The inferior vena cava is normal in size with greater than 50% respiratory variability, suggesting right atrial pressure of 3 mmHg.  Comparison(s): No prior Echocardiogram.  Stress test 08/25/2021:  Narrative & Impression     The study is low risk.   Patient exercised according to Bruce protocol for 7:53min achieving 10.1 METs. Target HR was achieved with max HR 142 (91% MPHR)   There were no ST deviations noted during exercise ECG.   LV perfusion is abnormal. There is a small defect with mild reduction in uptake present in the apical to mid anteroseptal location(s) that is reversible consistent with possible ischemia. May also be related to RV insertion point as wall motion appears normal.   Left ventricular function is normal. End diastolic cavity size is normal.  Past Medical History:  Diagnosis Date   Arthritis    Cancer (HCC)    skin cancer   Carpal tunnel syndrome    Essential hypertension    Factor 5 Leiden mutation, heterozygous (HCC)    GERD (gastroesophageal reflux disease)    History of DVT (deep vein thrombosis) 2020   Left leg   History of kidney stones    Hyperlipidemia    OSA on CPAP    Pre-diabetes     Past Surgical History:  Procedure Laterality Date   ANKLE SURGERY  1996   Left   CARPAL TUNNEL RELEASE Left 04/08/2015   Procedure: LEFT CARPAL TUNNEL RELEASE;  Surgeon: Arley Curia, MD;  Location: [facility];  Service: Orthopedics;  Laterality: Left;   CARPAL TUNNEL RELEASE Right 05/06/2015   Procedure: RIGHT CARPAL TUNNEL RELEASE;  Surgeon: Arley Curia, MD;  Location: [facility];  Service: Orthopedics;  Laterality: Right;  ANESTHESIA:  IV REGIONAL FAB   COLONOSCOPY  02/2010   Dr. harvey: Extremely redundant and tortuous sigmoid and transverse colon.  Normal ileocecal valve, cecum could not be intubated.  Noted to have diverticulosis.  Advised to have follow-up screening colonoscopy in 5 years [facility] overtube and fluoroscopy.   CORONARY PRESSURE/FFR STUDY N/A 09/09/2021   Procedure: INTRAVASCULAR PRESSURE WIRE/FFR STUDY;  Surgeon: Anner Alm ORN, MD;  Location: [facility] INVASIVE CV LAB;  Service: Cardiovascular;  Laterality: N/A;   ESOPHAGOGASTRODUODENOSCOPY N/A 03/09/2018   Fields: Benign-appearing esophageal stricture, small hiatal hernia, mild gastritis with benign biopsies.  No H. pylori.   HERNIA REPAIR     INSERTION OF MESH N/A 04/10/2014   Procedure: INSERTION OF MESH;  Surgeon: Oneil DELENA Budge, MD;  [facility];  Service: General;  Laterality:  N/A;   LEFT HEART CATH AND CORONARY ANGIOGRAPHY N/A 09/09/2021   Procedure: LEFT HEART CATH AND CORONARY ANGIOGRAPHY;  Surgeon: Anner Alm ORN, MD;  Location: [facility] INVASIVE CV LAB;  Service: Cardiovascular;  Laterality: N/A;   SAVORY DILATION N/A 03/09/2018   Procedure: SAVORY DILATION;  Surgeon: Harvey Margo CROME, MD;  [facility];  Service: Endoscopy;  Laterality: N/A;   TOTAL KNEE ARTHROPLASTY Left 05/28/2022   Procedure: TOTAL KNEE ARTHROPLASTY;  Surgeon: Shari Sieving, MD;  [facility];  Service: Orthopedics;  Laterality: Left;   UMBILICAL HERNIA REPAIR N/A 04/10/2014   Procedure: HERNIA REPAIR UMBILICAL ADULT;  Surgeon: Oneil DELENA Budge, MD;  Location[facility];  Service: General;  Laterality: N/A;    MEDICATIONS:  acetaminophen  (TYLENOL ) 500 MG tablet   amLODipine (NORVASC)  10 MG tablet   Ascorbic Acid (VITAMIN C PO)   Cholecalciferol (VITAMIN D) 50 MCG (2000 UT) tablet   metoprolol  succinate (TOPROL -XL) 25 MG 24 hr tablet   oxymetazoline (AFRIN) 0.05 % nasal spray   OZEMPIC, 2 MG/DOSE, 8 MG/3ML SOPN   pantoprazole  (PROTONIX ) 40 MG tablet   rosuvastatin (CRESTOR) 10 MG tablet   traZODone (DESYREL) 100 MG tablet   valsartan (DIOVAN) 160 MG tablet   warfarin (COUMADIN ) 3 MG tablet   No current facility-administered medications for this encounter.

## 2024-04-24 NOTE — H&P (Signed)
 KNEE ARTHROPLASTY ADMISSION H&P  Patient ID: Carl [name] MRN: [id number] DOB/AGE: 1955/12/09 68 y.o.  Chief Complaint: right knee pain.  Planned Procedure Date: 05/01/24 Medical Clearance by Dr. Gaither Langton   Cardiac Clearance by Katlyn West NP   HPI: Carl [name] is a 68 y.o. male who presents for evaluation of OA RIGHT KNEE. The patient has a history of pain and functional disability in the right knee due to arthritis and has failed non-surgical conservative treatments for greater than 12 weeks to include NSAID's and/or analgesics, corticosteriod injections, use of assistive devices, weight reduction as appropriate, and activity modification.  Onset of symptoms was gradual, starting 2 years ago with gradually worsening course since that time. The patient noted no past surgery on the right knee.  Patient currently rates pain at 8 out of 10 with activity. Patient has night pain, worsening of pain with activity and weight bearing, and pain that interferes with activities of daily living.  Patient has evidence of subchondral sclerosis, periarticular osteophytes, and joint space narrowing by imaging studies.  There is no active infection.  Past Medical History:  Diagnosis Date   Arthritis    Cancer (HCC)    skin cancer   Carpal tunnel syndrome    Essential hypertension    Factor 5 Leiden mutation, heterozygous (HCC)    GERD (gastroesophageal reflux disease)    History of DVT (deep vein thrombosis) 2020   Left leg   History of kidney stones    Hyperlipidemia    OSA on CPAP    Pre-diabetes    Past Surgical History:  Procedure Laterality Date   ANKLE SURGERY  1996   Left   CARPAL TUNNEL RELEASE Left 04/08/2015   Procedure: LEFT CARPAL TUNNEL RELEASE;  Surgeon: Arley Curia, MD;  Location: [facility];  Service: Orthopedics;  Laterality: Left;   CARPAL TUNNEL RELEASE Right 05/06/2015   Procedure: RIGHT CARPAL TUNNEL RELEASE;  Surgeon: Arley Curia, MD;  Location: [facility];  Service: Orthopedics;  Laterality: Right;  ANESTHESIA:  IV REGIONAL FAB   COLONOSCOPY  02/2010   Dr. harvey: Extremely redundant and tortuous sigmoid and transverse colon.  Normal ileocecal valve, cecum could not be intubated.  Noted to have diverticulosis.  Advised to have follow-up screening colonoscopy in 5 years at [facility] overtube and fluoroscopy.   CORONARY PRESSURE/FFR STUDY N/A 09/09/2021   Procedure: INTRAVASCULAR PRESSURE WIRE/FFR STUDY;  Surgeon: Anner Alm ORN, MD;  Location: [facility] INVASIVE CV LAB;  Service: Cardiovascular;  Laterality: N/A;   ESOPHAGOGASTRODUODENOSCOPY N/A 03/09/2018   Fields: Benign-appearing esophageal stricture, small hiatal hernia, mild gastritis with benign biopsies.  No H. pylori.   HERNIA REPAIR     INSERTION OF MESH N/A 04/10/2014   Procedure: INSERTION OF MESH;  Surgeon: Oneil DELENA Budge, MD;  [facility];  Service: General;  Laterality: N/A;   LEFT HEART CATH AND CORONARY ANGIOGRAPHY N/A 09/09/2021   Procedure: LEFT HEART CATH AND CORONARY ANGIOGRAPHY;  Surgeon: Anner Alm ORN, MD;  Location: [facility] INVASIVE CV LAB;  Service: Cardiovascular;  Laterality: N/A;   SAVORY DILATION N/A 03/09/2018   Procedure: SAVORY DILATION;  Surgeon: harvey Margo CROME, MD;  [facility];  Service: Endoscopy;  Laterality: N/A;   TOTAL KNEE ARTHROPLASTY Left 05/28/2022   Procedure: TOTAL KNEE ARTHROPLASTY;  Surgeon: Shari Sieving, MD;  [facility];  Service: Orthopedics;  Laterality: Left;   UMBILICAL HERNIA REPAIR N/A 04/10/2014   Procedure:  HERNIA REPAIR UMBILICAL ADULT;  Surgeon: Oneil DELENA Budge, MD;  [facility];  Service: General;  Laterality: N/A;   Allergies  Allergen Reactions   Amoxicillin Other (See Comments)    Joints ache and can't move Has patient had a PCN reaction causing immediate rash, facial/tongue/throat swelling, SOB or lightheadedness with hypotension: No Has patient had a PCN  reaction causing severe rash involving mucus membranes or skin necrosis: No Has patient had a PCN reaction that required hospitalization: No Has patient had a PCN reaction occurring within the last 10 years: Yes If all of the above answers are NO, then may proceed with Cephalosporin use.     Other Diarrhea    Alfredo sauce    Prior to Admission medications   Medication Sig Start Date End Date Taking? Authorizing Provider  acetaminophen  (TYLENOL ) 500 MG tablet Take 500-1,000 mg by mouth every 6 (six) hours as needed for moderate pain or headache.   Yes [provider]  amLODipine (NORVASC) 10 MG tablet Take 10 mg by mouth in the morning. 12/02/20  Yes [provider]  metoprolol  succinate (TOPROL -XL) 25 MG 24 hr tablet TAKE 1/2 TABLET BY MOUTH EVERY DAY 02/17/22  Yes Debera Jayson MATSU, MD  oxymetazoline (AFRIN) 0.05 % nasal spray Place 2 sprays into both nostrils at bedtime as needed for congestion.   Yes [provider]  OZEMPIC, 2 MG/DOSE, 8 MG/3ML SOPN Inject 2 mg into the skin every Saturday. 03/25/23  Yes [provider]  pantoprazole  (PROTONIX ) 40 MG tablet Take 40 mg by mouth daily before breakfast.   Yes [provider]  rosuvastatin (CRESTOR) 10 MG tablet Take 10 mg by mouth at bedtime.   Yes [provider]  traZODone (DESYREL) 100 MG tablet Take 100 mg by mouth at bedtime.   Yes [provider]  valsartan (DIOVAN) 160 MG tablet Take 160 mg by mouth in the morning.   Yes [provider]  warfarin (COUMADIN ) 3 MG tablet Take 3 mg by mouth every evening. =   Yes [provider]  Ascorbic Acid (VITAMIN C PO) Take 1 tablet by mouth every evening.    [provider]  Cholecalciferol (VITAMIN D) 50 MCG (2000 UT) tablet Take 2,000 Units by mouth at bedtime.    [provider]  enoxaparin (LOVENOX) 150 MG/ML injection Inject 150 mg into the skin daily. Starting 08/01    [provider]   enoxaparin (LOVENOX) 150 MG/ML injection Inject 150 mg into the skin. Once daily starting 04/27/2024-04/30/2024 then resume after surgery    [provider]   Social History   Socioeconomic History   Marital status: Widowed    Spouse name: Vina   Number of children: Not on file   Years of education: Not on file   Highest education level: Not on file  Occupational History   Occupation: med Quarry manager  Tobacco Use   Smoking status: Never   Smokeless tobacco: Never  Vaping Use   Vaping status: Never Used  Substance and Sexual Activity   Alcohol use: Yes    Comment: Occasional   Drug use: No   Sexual activity: Not Currently  Other Topics Concern   Not on file  Social History Narrative   RETIRED FROM [employer].   Social Drivers of Corporate investment banker Strain: Not on file  Food Insecurity: Not on file  Transportation Needs: Not on file  Physical Activity: Not on file  Stress: Not on file  Social Connections: Not on file   Family History  Problem Relation Age of Onset   Diabetes Mother    Hypertension Father    Clotting disorder Father    COPD Father    Rheum arthritis Sister    Colon cancer Nephew        50   Colon polyps Neg Hx     ROS: Currently denies lightheadedness, dizziness, Fever, chills, CP, SOB.   No personal history of PE, MI, or CVA. + h/o DVT in left leg 2020, on Warfarin since No loose teeth or dentures All other systems have been reviewed and were otherwise currently negative with the exception of those mentioned in the HPI and as above.  Objective: Vitals: Ht: 5'7 Wt: 245.2 lbs Temp: 97.5 BP: 124/92 Pulse: 76 O2 96% on room air.   Physical Exam: General: Alert, NAD.  Antalgic Gait  HEENT: EOMI, Good Neck Extension  Pulm: No increased work of breathing.  Clear B/L A/P w/o crackle or wheeze.  CV: RRR, No m/g/r appreciated  GI: soft, NT, ND. BS x 4 quadrants Neuro: CN II-XII grossly intact without focal deficit.   Sensation intact distally Skin: No lesions in the area of chief complaint MSK/Surgical Site:  + JLT. ROM 0-120 degrees.  5/5 strength in extension and flexion.  +EHL/FHL.  NVI.  Slight pain and instability with varus and valgus stress. Obvious varus alignment to leg   Imaging Review Plain radiographs demonstrate severe degenerative joint disease of the right knee.   The overall alignment issignificant varus. The bone quality appears to be fair for age and reported activity level.  Preoperative templating of the joint replacement has been completed, documented, and submitted to the Operating Room personnel in order to optimize intra-operative equipment management.  Assessment: OA RIGHT KNEE Active Problems:   * No active hospital problems. *   Plan: Plan for Procedure(s): ARTHROPLASTY, KNEE, TOTAL  The patient history, physical exam, clinical judgement of the provider and imaging are consistent with end stage degenerative joint disease and total joint arthroplasty is deemed medically necessary. The treatment options including medical management, injection therapy, and arthroplasty were discussed at length. The risks and benefits of Procedure(s): ARTHROPLASTY, KNEE, TOTAL were presented and reviewed.  The risks of nonoperative treatment, versus surgical intervention including but not limited to continued pain, aseptic loosening, stiffness, dislocation/subluxation, infection, bleeding, nerve injury, blood clots, cardiopulmonary complications, morbidity, mortality, among others were discussed. The patient verbalizes understanding and wishes to proceed with the plan.  Patient is being admitted for inpatient treatment for surgery, pain control, PT, prophylactic antibiotics, VTE prophylaxis, progressive ambulation, ADL's and discharge planning.   Dental prophylaxis discussed and recommended for 2 years postoperatively.  The patient does meet the criteria for TXA which will be used  perioperatively.   Lovenox bridging for Warfarin by [name] will be used postoperatively for DVT prophylaxis in addition to SCDs, and early ambulation. Plan for Tylenol  and Oxycodone  for pain.   Robaxin  for muscle spasms.   Zofran  for nausea and vomiting. Senokot is for constipation prevention. [facility]acy- [facility] [place] The patient is planning to be discharged home with OPPT and into the care of his daughter Therisa who can be reached at [phone] Follow up appt 05/16/24 at 10:10am in [facility] CHRISTE[place]A Large, [place] Office [phone] 04/30/2024 4:29 PM

## 2024-04-30 ENCOUNTER — Encounter (HOSPITAL_COMMUNITY): Payer: Self-pay | Admitting: Orthopedic Surgery

## 2024-04-30 NOTE — Anesthesia Preprocedure Evaluation (Addendum)
 Anesthesia Evaluation  Patient identified by MRN, date of birth, ID band Patient awake    Reviewed: Allergy & Precautions, NPO status , Patient's Chart, lab work & pertinent test results  Airway Mallampati: II       Dental no notable dental hx. (+) Teeth Intact, Caps, Dental Advisory Given   Pulmonary sleep apnea and Continuous Positive Airway Pressure Ventilation    Pulmonary exam normal breath sounds clear to auscultation       Cardiovascular hypertension, Pt. on medications and Pt. on home beta blockers Normal cardiovascular exam Rhythm:Regular Rate:Normal     Neuro/Psych  Neuromuscular disease  negative psych ROS   GI/Hepatic Neg liver ROS,GERD  Medicated,,  Endo/Other  diabetes, Well Controlled, Type 2  HLD Pre diabetes Obesity GLP-1 RA therapy- last dose 7/26  Renal/GU Renal diseaseHx/o renal calculi  negative genitourinary   Musculoskeletal  (+) Arthritis , Osteoarthritis,  OA right knee   Abdominal  (+) + obese  Peds  Hematology Coumadin  therapy- last dose 7/31   Anesthesia Other Findings   Reproductive/Obstetrics                              Anesthesia Physical Anesthesia Plan  ASA: 3  Anesthesia Plan: Spinal   Post-op Pain Management: Minimal or no pain anticipated and Regional block*   Induction:   PONV Risk Score and Plan: 2 and Treatment may vary due to age or medical condition, Propofol  infusion and Ondansetron   Airway Management Planned: Natural Airway and Simple Face Mask  Additional Equipment: None  Intra-op Plan:   Post-operative Plan:   Informed Consent: I have reviewed the patients History and Physical, chart, labs and discussed the procedure including the risks, benefits and alternatives for the proposed anesthesia with the patient or authorized representative who has indicated his/her understanding and acceptance.     Dental advisory given  Plan  Discussed with: CRNA and Anesthesiologist  Anesthesia Plan Comments: (See PAT note from 7/24)         Anesthesia Quick Evaluation

## 2024-05-01 ENCOUNTER — Ambulatory Visit (HOSPITAL_COMMUNITY)

## 2024-05-01 ENCOUNTER — Ambulatory Visit (HOSPITAL_COMMUNITY)
Admission: RE | Admit: 2024-05-01 | Discharge: 2024-05-01 | Disposition: A | Discharge status: Home / Self Care | Source: Ambulatory Visit | Attending: Orthopedic Surgery | Admitting: Orthopedic Surgery

## 2024-05-01 ENCOUNTER — Ambulatory Visit (HOSPITAL_BASED_OUTPATIENT_CLINIC_OR_DEPARTMENT_OTHER): Payer: Self-pay | Admitting: Medical

## 2024-05-01 ENCOUNTER — Encounter (HOSPITAL_COMMUNITY)
Admission: RE | Disposition: A | Discharge status: Home / Self Care | Payer: Self-pay | Source: Ambulatory Visit | Attending: Orthopedic Surgery

## 2024-05-01 ENCOUNTER — Encounter (HOSPITAL_COMMUNITY): Payer: Self-pay | Admitting: Orthopedic Surgery

## 2024-05-01 ENCOUNTER — Ambulatory Visit (HOSPITAL_COMMUNITY): Payer: Self-pay | Admitting: Medical

## 2024-05-01 DIAGNOSIS — Z01818 Encounter for other preprocedural examination: Secondary | ICD-10-CM

## 2024-05-01 DIAGNOSIS — E119 Type 2 diabetes mellitus without complications: Secondary | ICD-10-CM | POA: Diagnosis not present

## 2024-05-01 DIAGNOSIS — E785 Hyperlipidemia, unspecified: Secondary | ICD-10-CM | POA: Diagnosis not present

## 2024-05-01 DIAGNOSIS — Z7901 Long term (current) use of anticoagulants: Secondary | ICD-10-CM | POA: Insufficient documentation

## 2024-05-01 DIAGNOSIS — I10 Essential (primary) hypertension: Secondary | ICD-10-CM | POA: Diagnosis not present

## 2024-05-01 DIAGNOSIS — Z79899 Other long term (current) drug therapy: Secondary | ICD-10-CM | POA: Insufficient documentation

## 2024-05-01 DIAGNOSIS — M1711 Unilateral primary osteoarthritis, right knee: Secondary | ICD-10-CM

## 2024-05-01 DIAGNOSIS — G4730 Sleep apnea, unspecified: Secondary | ICD-10-CM | POA: Insufficient documentation

## 2024-05-01 DIAGNOSIS — Z7985 Long-term (current) use of injectable non-insulin antidiabetic drugs: Secondary | ICD-10-CM | POA: Diagnosis not present

## 2024-05-01 DIAGNOSIS — D6851 Activated protein C resistance: Secondary | ICD-10-CM | POA: Insufficient documentation

## 2024-05-01 DIAGNOSIS — G4733 Obstructive sleep apnea (adult) (pediatric): Secondary | ICD-10-CM | POA: Diagnosis not present

## 2024-05-01 DIAGNOSIS — Z96651 Presence of right artificial knee joint: Secondary | ICD-10-CM

## 2024-05-01 DIAGNOSIS — K219 Gastro-esophageal reflux disease without esophagitis: Secondary | ICD-10-CM | POA: Insufficient documentation

## 2024-05-01 HISTORY — PX: TOTAL KNEE ARTHROPLASTY: SHX125

## 2024-05-01 HISTORY — DX: Type 2 diabetes mellitus without complications: E11.9

## 2024-05-01 LAB — GLUCOSE, CAPILLARY
Glucose-Capillary: 115 mg/dL — ABNORMAL HIGH (ref 70–99)
Glucose-Capillary: 118 mg/dL — ABNORMAL HIGH (ref 70–99)

## 2024-05-01 LAB — PROTIME-INR
INR: 1.3 — ABNORMAL HIGH (ref 0.8–1.2)
Prothrombin Time: 16.8 s — ABNORMAL HIGH (ref 11.4–15.2)

## 2024-05-01 SURGERY — ARTHROPLASTY, KNEE, TOTAL
Anesthesia: Spinal | Site: Knee | Laterality: Right

## 2024-05-01 MED ORDER — INSULIN ASPART 100 UNIT/ML IJ SOLN: 0.0000 [IU] | INTRAMUSCULAR | Status: DC | PRN

## 2024-05-01 MED ORDER — SENNA-DOCUSATE SODIUM 8.6-50 MG PO TABS: 2.0000 | ORAL_TABLET | Freq: Every day | ORAL | 1 refills | Status: AC | PRN

## 2024-05-01 MED ORDER — ONDANSETRON HCL 4 MG/2ML IJ SOLN: 4.0000 mg | Freq: Four times a day (QID) | INTRAMUSCULAR | Status: DC | PRN

## 2024-05-01 MED ORDER — BUPIVACAINE LIPOSOME 1.3 % IJ SUSP
INTRAMUSCULAR | Status: AC
Start: 2024-05-01 — End: 2024-05-01
  Filled 2024-05-01: qty 20

## 2024-05-01 MED ORDER — POVIDONE-IODINE 10 % EX SWAB
2.0000 | Freq: Once | CUTANEOUS | Status: AC
  Administered 2024-05-01: 2 via TOPICAL

## 2024-05-01 MED ORDER — TRANEXAMIC ACID-NACL 1000-0.7 MG/100ML-% IV SOLN
1000.0000 mg | Freq: Once | INTRAVENOUS | Status: AC
  Administered 2024-05-01: 1000 mg via INTRAVENOUS

## 2024-05-01 MED ORDER — ONDANSETRON HCL 4 MG/2ML IJ SOLN
INTRAMUSCULAR | Status: DC | PRN
  Administered 2024-05-01: 4 mg via INTRAVENOUS

## 2024-05-01 MED ORDER — WARFARIN SODIUM 3 MG PO TABS: 3.0000 mg | ORAL_TABLET | Freq: Once | ORAL | Status: DC

## 2024-05-01 MED ORDER — LACTATED RINGERS IV SOLN: INTRAVENOUS | Status: DC

## 2024-05-01 MED ORDER — ORAL CARE MOUTH RINSE: 15.0000 mL | Freq: Once | OROMUCOSAL | Status: AC

## 2024-05-01 MED ORDER — PHENYLEPHRINE HCL (PRESSORS) 10 MG/ML IV SOLN
INTRAVENOUS | Status: DC | PRN
  Administered 2024-05-01 (×8): 80 ug via INTRAVENOUS
  Administered 2024-05-01: 160 ug via INTRAVENOUS
  Administered 2024-05-01: 80 ug via INTRAVENOUS

## 2024-05-01 MED ORDER — GLYCOPYRROLATE 0.2 MG/ML IJ SOLN
INTRAMUSCULAR | Status: DC | PRN
  Administered 2024-05-01: .1 mg via INTRAVENOUS

## 2024-05-01 MED ORDER — BUPIVACAINE IN DEXTROSE 0.75-8.25 % IT SOLN
INTRATHECAL | Status: DC | PRN
Start: 2024-05-01 — End: 2024-05-01
  Administered 2024-05-01: 2 mL via INTRATHECAL

## 2024-05-01 MED ORDER — DEXAMETHASONE SODIUM PHOSPHATE 10 MG/ML IJ SOLN: INTRAMUSCULAR | Status: DC | PRN

## 2024-05-01 MED ORDER — HYDROMORPHONE HCL 1 MG/ML IJ SOLN: 0.5000 mg | INTRAMUSCULAR | Status: DC | PRN

## 2024-05-01 MED ORDER — MUPIROCIN 2 % EX OINT: 1.0000 | TOPICAL_OINTMENT | Freq: Two times a day (BID) | CUTANEOUS | 0 refills | Status: AC

## 2024-05-01 MED ORDER — TRANEXAMIC ACID-NACL 1000-0.7 MG/100ML-% IV SOLN
INTRAVENOUS | Status: AC
Start: 2024-05-01 — End: 2024-05-01
  Filled 2024-05-01: qty 100

## 2024-05-01 MED ORDER — DEXAMETHASONE SODIUM PHOSPHATE 10 MG/ML IJ SOLN
8.0000 mg | Freq: Once | INTRAMUSCULAR | Status: AC
  Administered 2024-05-01: 5 mg via INTRAVENOUS

## 2024-05-01 MED ORDER — PHENYLEPHRINE HCL-NACL 20-0.9 MG/250ML-% IV SOLN
INTRAVENOUS | Status: DC | PRN
  Administered 2024-05-01: 40 ug/min via INTRAVENOUS

## 2024-05-01 MED ORDER — CEFAZOLIN SODIUM-DEXTROSE 2-4 GM/100ML-% IV SOLN
2.0000 g | Freq: Four times a day (QID) | INTRAVENOUS | Status: DC
  Administered 2024-05-01: 2 g via INTRAVENOUS

## 2024-05-01 MED ORDER — PHENYLEPHRINE HCL-NACL 20-0.9 MG/250ML-% IV SOLN
INTRAVENOUS | Status: AC
  Filled 2024-05-01: qty 250

## 2024-05-01 MED ORDER — BUPIVACAINE LIPOSOME 1.3 % IJ SUSP: 20.0000 mL | Freq: Once | INTRAMUSCULAR | Status: DC

## 2024-05-01 MED ORDER — LACTATED RINGERS IV BOLUS
250.0000 mL | Freq: Once | INTRAVENOUS | Status: AC
  Administered 2024-05-01: 250 mL via INTRAVENOUS

## 2024-05-01 MED ORDER — CHLORHEXIDINE GLUCONATE 0.12 % MT SOLN
15.0000 mL | Freq: Once | OROMUCOSAL | Status: AC
  Administered 2024-05-01: 15 mL via OROMUCOSAL

## 2024-05-01 MED ORDER — WARFARIN - PHARMACIST DOSING INPATIENT: Freq: Every day | Status: DC

## 2024-05-01 MED ORDER — DEXAMETHASONE SODIUM PHOSPHATE 10 MG/ML IJ SOLN
INTRAMUSCULAR | Status: AC
  Filled 2024-05-01: qty 1

## 2024-05-01 MED ORDER — METHOCARBAMOL 750 MG PO TABS: 750.0000 mg | ORAL_TABLET | Freq: Three times a day (TID) | ORAL | 0 refills | Status: AC | PRN

## 2024-05-01 MED ORDER — TRANEXAMIC ACID-NACL 1000-0.7 MG/100ML-% IV SOLN
1000.0000 mg | INTRAVENOUS | Status: AC
Start: 2024-05-01 — End: 2024-05-01
  Administered 2024-05-01: 1000 mg via INTRAVENOUS
  Filled 2024-05-01: qty 100

## 2024-05-01 MED ORDER — ONDANSETRON 4 MG PO TBDP
4.0000 mg | ORAL_TABLET | Freq: Three times a day (TID) | ORAL | 0 refills | Status: AC | PRN
Start: 2024-05-01 — End: ?

## 2024-05-01 MED ORDER — OXYCODONE HCL 5 MG PO TABS
ORAL_TABLET | ORAL | Status: AC
  Filled 2024-05-01: qty 1

## 2024-05-01 MED ORDER — FENTANYL CITRATE (PF) 100 MCG/2ML IJ SOLN
INTRAMUSCULAR | Status: AC
  Filled 2024-05-01: qty 2

## 2024-05-01 MED ORDER — METHOCARBAMOL 1000 MG/10ML IJ SOLN: 500.0000 mg | Freq: Four times a day (QID) | INTRAMUSCULAR | Status: DC | PRN

## 2024-05-01 MED ORDER — CEFAZOLIN SODIUM-DEXTROSE 2-4 GM/100ML-% IV SOLN
INTRAVENOUS | Status: AC
  Filled 2024-05-01: qty 100

## 2024-05-01 MED ORDER — CEFAZOLIN SODIUM-DEXTROSE 2-4 GM/100ML-% IV SOLN
2.0000 g | INTRAVENOUS | Status: AC
Start: 2024-05-01 — End: 2024-05-01
  Administered 2024-05-01: 2 g via INTRAVENOUS
  Filled 2024-05-01: qty 100

## 2024-05-01 MED ORDER — OXYCODONE HCL 5 MG PO TABS: 5.0000 mg | ORAL_TABLET | ORAL | 0 refills | Status: AC | PRN

## 2024-05-01 MED ORDER — LACTATED RINGERS IV BOLUS
500.0000 mL | Freq: Once | INTRAVENOUS | Status: AC
  Administered 2024-05-01: 500 mL via INTRAVENOUS

## 2024-05-01 MED ORDER — GLYCOPYRROLATE 0.2 MG/ML IJ SOLN
INTRAMUSCULAR | Status: AC
  Filled 2024-05-01: qty 1

## 2024-05-01 MED ORDER — FENTANYL CITRATE (PF) 100 MCG/2ML IJ SOLN
INTRAMUSCULAR | Status: DC | PRN
  Administered 2024-05-01: 50 ug via INTRAVENOUS

## 2024-05-01 MED ORDER — EPHEDRINE SULFATE-NACL 50-0.9 MG/10ML-% IV SOSY
PREFILLED_SYRINGE | INTRAVENOUS | Status: DC | PRN
Start: 2024-05-01 — End: 2024-05-01
  Administered 2024-05-01 (×4): 5 mg via INTRAVENOUS

## 2024-05-01 MED ORDER — SODIUM CHLORIDE (PF) 0.9 % IJ SOLN
INTRAMUSCULAR | Status: AC
  Filled 2024-05-01: qty 30

## 2024-05-01 MED ORDER — OXYCODONE HCL 5 MG PO TABS: 10.0000 mg | ORAL_TABLET | ORAL | Status: DC | PRN

## 2024-05-01 MED ORDER — 0.9 % SODIUM CHLORIDE (POUR BTL) OPTIME
TOPICAL | Status: DC | PRN
  Administered 2024-05-01: 1000 mL

## 2024-05-01 MED ORDER — PROPOFOL 500 MG/50ML IV EMUL
INTRAVENOUS | Status: DC | PRN
Start: 2024-05-01 — End: 2024-05-01
  Administered 2024-05-01: 40 mg via INTRAVENOUS
  Administered 2024-05-01: 30 mg via INTRAVENOUS
  Administered 2024-05-01: 20 mg via INTRAVENOUS
  Administered 2024-05-01: 50 ug/kg/min via INTRAVENOUS
  Administered 2024-05-01: 220 mg via INTRAVENOUS
  Administered 2024-05-01: 30 mg via INTRAVENOUS

## 2024-05-01 MED ORDER — METHOCARBAMOL 500 MG PO TABS: 500.0000 mg | ORAL_TABLET | Freq: Four times a day (QID) | ORAL | Status: DC | PRN

## 2024-05-01 MED ORDER — OXYCODONE HCL 5 MG PO TABS
5.0000 mg | ORAL_TABLET | ORAL | Status: DC | PRN
  Administered 2024-05-01: 5 mg via ORAL

## 2024-05-01 MED ORDER — WATER FOR IRRIGATION, STERILE IR SOLN
Status: DC | PRN
  Administered 2024-05-01: 2000 mL

## 2024-05-01 MED ORDER — METOCLOPRAMIDE HCL 5 MG/ML IJ SOLN: 5.0000 mg | Freq: Three times a day (TID) | INTRAMUSCULAR | Status: DC | PRN

## 2024-05-01 MED ORDER — EPHEDRINE 5 MG/ML INJ
INTRAVENOUS | Status: AC
  Filled 2024-05-01: qty 5

## 2024-05-01 MED ORDER — ONDANSETRON HCL 4 MG/2ML IJ SOLN
INTRAMUSCULAR | Status: AC
  Filled 2024-05-01: qty 2

## 2024-05-01 MED ORDER — METOCLOPRAMIDE HCL 5 MG PO TABS: 5.0000 mg | ORAL_TABLET | Freq: Three times a day (TID) | ORAL | Status: DC | PRN

## 2024-05-01 MED ORDER — BUPIVACAINE-EPINEPHRINE 0.25% -1:200000 IJ SOLN
INTRAMUSCULAR | Status: DC | PRN
  Administered 2024-05-01: 80 mL

## 2024-05-01 MED ORDER — CHLORHEXIDINE GLUCONATE 4 % EX SOLN: 1.0000 | CUTANEOUS | 1 refills | Status: AC

## 2024-05-01 MED ORDER — ONDANSETRON HCL 4 MG PO TABS: 4.0000 mg | ORAL_TABLET | Freq: Four times a day (QID) | ORAL | Status: DC | PRN

## 2024-05-01 MED ORDER — PHENYLEPHRINE 80 MCG/ML (10ML) SYRINGE FOR IV PUSH (FOR BLOOD PRESSURE SUPPORT)
PREFILLED_SYRINGE | INTRAVENOUS | Status: AC
  Filled 2024-05-01: qty 10

## 2024-05-01 MED ORDER — BUPIVACAINE-EPINEPHRINE (PF) 0.25% -1:200000 IJ SOLN
INTRAMUSCULAR | Status: AC
  Filled 2024-05-01: qty 30

## 2024-05-01 MED ORDER — POVIDONE-IODINE 10 % EX SWAB: 2.0000 | Freq: Once | CUTANEOUS | Status: DC

## 2024-05-01 MED ORDER — ACETAMINOPHEN 500 MG PO TABS
1000.0000 mg | ORAL_TABLET | Freq: Once | ORAL | Status: AC
  Administered 2024-05-01: 1000 mg via ORAL
  Filled 2024-05-01: qty 2

## 2024-05-01 MED ORDER — ACETAMINOPHEN 500 MG PO TABS: 1000.0000 mg | ORAL_TABLET | Freq: Four times a day (QID) | ORAL | Status: DC

## 2024-05-01 MED ORDER — ROPIVACAINE HCL 5 MG/ML IJ SOLN
INTRAMUSCULAR | Status: DC | PRN
Start: 2024-05-01 — End: 2024-05-01
  Administered 2024-05-01: 30 mL via PERINEURAL

## 2024-05-01 MED ORDER — SODIUM CHLORIDE 0.9 % IR SOLN
Status: DC | PRN
  Administered 2024-05-01: 1000 mL

## 2024-05-01 SURGICAL SUPPLY — 44 items
BAG COUNTER SPONGE SURGICOUNT (BAG) IMPLANT
BLADE SAG 18X100X1.27 (BLADE) ×1 IMPLANT
BLADE SAGITTAL 25.0X1.37X90 (BLADE) ×1 IMPLANT
BLADE SURG 15 STRL LF DISP TIS (BLADE) ×1 IMPLANT
BNDG ELASTIC 6X10 VLCR STRL LF (GAUZE/BANDAGES/DRESSINGS) ×1 IMPLANT
BOWL SMART MIX CTS (DISPOSABLE) IMPLANT
CLSR STERI-STRIP ANTIMIC 1/2X4 (GAUZE/BANDAGES/DRESSINGS) ×2 IMPLANT
COMPONENT TRI CR RETAIN SZ6 RT (Miscellaneous) IMPLANT
COVER SURGICAL LIGHT HANDLE (MISCELLANEOUS) ×1 IMPLANT
CUFF TRNQT CYL 34X4.125X (TOURNIQUET CUFF) ×1 IMPLANT
DRAPE U-SHAPE 47X51 STRL (DRAPES) ×1 IMPLANT
DRSG MEPILEX POST OP 4X12 (GAUZE/BANDAGES/DRESSINGS) ×1 IMPLANT
DURAPREP 26ML APPLICATOR (WOUND CARE) ×2 IMPLANT
ELECT PENCIL ROCKER SW 15FT (MISCELLANEOUS) ×1 IMPLANT
ELECT REM PT RETURN 15FT ADLT (MISCELLANEOUS) ×1 IMPLANT
GLOVE BIO SURGEON STRL SZ7.5 (GLOVE) ×1 IMPLANT
GLOVE BIOGEL PI IND STRL 7.5 (GLOVE) ×1 IMPLANT
GLOVE BIOGEL PI IND STRL 8 (GLOVE) ×1 IMPLANT
GLOVE SURG SYN 7.0 PF PI (GLOVE) ×1 IMPLANT
GOWN STRL REUS W/ TWL LRG LVL3 (GOWN DISPOSABLE) ×1 IMPLANT
GOWN STRL REUS W/ TWL XL LVL3 (GOWN DISPOSABLE) ×1 IMPLANT
HOLDER FOLEY CATH W/STRAP (MISCELLANEOUS) IMPLANT
IMMOBILIZER KNEE 20 THIGH 36 (SOFTGOODS) IMPLANT
IMMOBILIZER KNEE 22 UNIV (SOFTGOODS) IMPLANT
INSERT TRIATHLON CS TIB X3 9 (Joint) IMPLANT
KIT TURNOVER KIT A (KITS) ×1 IMPLANT
KNEE PATELLA ASYMMETRIC 10X32 (Knees) IMPLANT
KNEE TIBIAL COMPONENT SZ6 (Knees) IMPLANT
MANIFOLD NEPTUNE II (INSTRUMENTS) ×1 IMPLANT
NS IRRIG 1000ML POUR BTL (IV SOLUTION) ×1 IMPLANT
PACK TOTAL KNEE CUSTOM (KITS) ×1 IMPLANT
PIN FLUTED HEDLESS FIX 3.5X1/8 (PIN) IMPLANT
PROTECTOR NERVE ULNAR (MISCELLANEOUS) ×1 IMPLANT
SET HNDPC FAN SPRY TIP SCT (DISPOSABLE) ×1 IMPLANT
SPIKE FLUID TRANSFER (MISCELLANEOUS) ×1 IMPLANT
STRIP CLOSURE SKIN 1/2X4 (GAUZE/BANDAGES/DRESSINGS) IMPLANT
SUT MNCRL AB 3-0 PS2 18 (SUTURE) ×1 IMPLANT
SUT VIC AB 0 CT1 36 (SUTURE) ×2 IMPLANT
SUT VIC AB 1 CT1 36 (SUTURE) ×1 IMPLANT
SUT VIC AB 2-0 CT1 TAPERPNT 27 (SUTURE) ×1 IMPLANT
TOWEL GREEN STERILE FF (TOWEL DISPOSABLE) ×1 IMPLANT
TRAY FOLEY MTR SLVR 16FR STAT (SET/KITS/TRAYS/PACK) IMPLANT
TUBE SUCTION HIGH CAP CLEAR NV (SUCTIONS) ×1 IMPLANT
WRAP KNEE MAXI GEL POST OP (GAUZE/BANDAGES/DRESSINGS) ×1 IMPLANT

## 2024-05-01 NOTE — Anesthesia Procedure Notes (Signed)
 Anesthesia Regional Block: Adductor canal block   Pre-Anesthetic Checklist: , timeout performed,  Correct Patient, Correct Site, Correct Laterality,  Correct Procedure, Correct Position, site marked,  Risks and benefits discussed,  Surgical consent,  Pre-op evaluation,  At surgeon's request and post-op pain management  Laterality: Right  Prep: chloraprep       Needles:  Injection technique: Single-shot  Needle Type: Echogenic Stimulator Needle     Needle Length: 10cm  Needle Gauge: 21   Needle insertion depth: 7 cm   Additional Needles:   Procedures:,,,, ultrasound used (permanent image in chart),,    Narrative:  Start time: 05/01/2024 7:13 AM End time: 05/01/2024 7:18 AM Injection made incrementally with aspirations every 5 mL.  Performed by: Personally  Anesthesiologist: Jerrye Sharper, MD  Additional Notes: Timeout performed. Patient sedated. Relevant anatomy ID'd using US . Incremental 2-5ml injection of LA with frequent aspiration. Patient tolerated procedure well.

## 2024-05-01 NOTE — Interval H&P Note (Signed)
 History and Physical Interval Note:  05/01/2024 6:46 AM  Carl [name]  has presented today for surgery, with the diagnosis of OA RIGHT KNEE.  The various methods of treatment have been discussed with the patient and family. After consideration of risks, benefits and other options for treatment, the patient has consented to  Procedure(s): ARTHROPLASTY, KNEE, TOTAL (Right) as a surgical intervention.  The patient's history has been reviewed, patient examined, no change in status, stable for surgery.  I have reviewed the patient's chart and labs.  Questions were answered to the patient's satisfaction.     Carl [name]

## 2024-05-01 NOTE — Anesthesia Procedure Notes (Signed)
 Procedure Name: MAC Date/Time: 05/01/2024 7:30 AM  Performed by: Joshua Vernell BROCKS, CRNAPre-anesthesia Checklist: Patient identified, Emergency Drugs available, Suction available and Patient being monitored Patient Re-evaluated:Patient Re-evaluated prior to induction Oxygen Delivery Method: Simple face mask Placement Confirmation: positive ETCO2 and breath sounds checked- equal and bilateral Dental Injury: Teeth and Oropharynx as per pre-operative assessment

## 2024-05-01 NOTE — Evaluation (Signed)
 Physical Therapy Evaluation Patient Details Name: Carl [name] MRN: [id number] DOB: 1956-02-01 Today's Date: 05/01/2024  History of Present Illness  68 yo male presents to therapy s/p RTKA on 05/01/2024 due to failure of conservative measures. Pt PMH includes but is not limited to: GERD, hx of LLE DVT, HLD, DM II, OSA on CPAP, factor 5 Leiden mutation, bilateral carpel tunnel release and LTKA on 05/28/22.  Clinical Impression    Carl [name] is a 68 y.o. male POD 0 s/p R TKA. Patient reports IND with mobility at baseline. Patient is now limited by functional impairments (see PT problem list below) and requires CGA to S for transfers and gait with RW. Patient was able to ambulate 50 and 25 feet with RW and CGA and progressing to close S and cues for safe walker management. Patient educated on safe sequencing for stair mobility with RW, fall risk prevention, slowly increasing activity levels, pain management and goal, use of CP/ice, CPM, bone foam and car transfers pt and daughter verbalized understanding of safe guarding position for people assisting with mobility. Patient instructed in exercises to facilitate ROM and circulation reviewed and HO provided. Patient will benefit from continued skilled PT interventions to address impairments and progress towards PLOF. Patient has met mobility goals at adequate level for discharge home with family support and OPPT services; will continue to follow if pt continues acute stay to progress towards Mod I goals.       If plan is discharge home, recommend the following: A little help with walking and/or transfers;A little help with bathing/dressing/bathroom;Assistance with cooking/housework;Assist for transportation;Help with stairs or ramp for entrance   Can travel by private vehicle        Equipment Recommendations None recommended by PT  Recommendations for Other Services       Functional Status Assessment Patient has had a recent decline in their  functional status and demonstrates the ability to make significant improvements in function in a reasonable and predictable amount of time.     Precautions / Restrictions Precautions Precautions: Knee;Fall Restrictions Weight Bearing Restrictions Per Provider Order: No      Mobility  Bed Mobility Overal bed mobility: Needs Assistance Bed Mobility: Supine to Sit     Supine to sit: Supervision     General bed mobility comments: min cues    Transfers Overall transfer level: Needs assistance Equipment used: Rolling walker (2 wheels) Transfers: Sit to/from Stand Sit to Stand: Contact guard assist, Supervision           General transfer comment: min cues for bed and recliner transfers pt able to progress from CGA to S    Ambulation/Gait Ambulation/Gait assistance: Contact guard assist, Supervision Gait Distance (Feet): 50 Feet Assistive device: Rolling walker (2 wheels) Gait Pattern/deviations: Step-to pattern, Decreased stance time - right, Antalgic, Trunk flexed Gait velocity: decreased     General Gait Details: slight trunk flexion with B UE support at Rw with min cues  Stairs Stairs: Yes Stairs assistance: Contact guard assist Stair Management: Two rails Number of Stairs: 3 General stair comments: step navigation ed provided on use of Rw to navigate one step to enter home and pt able to place Rw at top of adpative steps and use of B handrail to complete task with min cues for sequencing and technique  Wheelchair Mobility     Tilt Bed    Modified Rankin (Stroke Patients Only)       Balance Overall balance assessment: Needs assistance Sitting-balance support:  Feet supported Sitting balance-Leahy Scale: Good     Standing balance support: Bilateral upper extremity supported, During functional activity, Reliant on assistive device for balance Standing balance-Leahy Scale: Fair Standing balance comment: static standing no UE support                              Pertinent Vitals/Pain Pain Assessment Pain Assessment: 0-10 Pain Score: 0-No pain Pain Location: R LE and knee Pain Intervention(s): Limited activity within patient's tolerance, Monitored during session, Premedicated before session, Repositioned, Ice applied    Home Living Family/patient expects to be discharged to:: Private residence Living Arrangements: Alone Available Help at Discharge: Family Type of Home: House Home Access: Stairs to enter   Secretary/administrator of Steps: 1   Home Layout: One level Home Equipment: Agricultural consultant (2 wheels);Shower seat;Cane - single point      Prior Function Prior Level of Function : Independent/Modified Independent;Driving             Mobility Comments: IND with all ADLs, self care tasks and IADLs       Extremity/Trunk Assessment        Lower Extremity Assessment Lower Extremity Assessment: RLE deficits/detail RLE Deficits / Details: ankle DF/PF 5/5; SLR < 10 degree lag RLE Sensation: WNL    Cervical / Trunk Assessment Cervical / Trunk Assessment: Normal  Communication   Communication Communication: No apparent difficulties    Cognition Arousal: Alert Behavior During Therapy: WFL for tasks assessed/performed   PT - Cognitive impairments: No apparent impairments                         Following commands: Intact       Cueing       General Comments      Exercises Total Joint Exercises Ankle Circles/Pumps: AROM, Both, 10 reps Quad Sets: AROM, Right, 5 reps Short Arc Quad: AROM, Right, 5 reps Heel Slides: AROM, Right, 5 reps Hip ABduction/ADduction: AROM, Right, 5 reps Straight Leg Raises: AROM, Right, 5 reps Long Arc Quad: AROM, Right, 5 reps Knee Flexion: AROM, Right, 5 reps, Seated   Assessment/Plan    PT Assessment Patient needs continued PT services  PT Problem List Decreased strength;Decreased range of motion;Decreased balance;Decreased activity tolerance;Decreased  coordination;Decreased mobility;Pain       PT Treatment Interventions DME instruction;Stair training;Gait training;Functional mobility training;Therapeutic activities;Therapeutic exercise;Balance training;Neuromuscular re-education;Patient/family education;Modalities    PT Goals (Current goals can be found in the Care Plan section)  Acute Rehab PT Goals Patient Stated Goal: to be able to walk continously for 3 miles no AD PT Goal Formulation: With patient Time For Goal Achievement: 05/15/24 Potential to Achieve Goals: Good    Frequency 7X/week     Co-evaluation               AM-PAC PT 6 Clicks Mobility  Outcome Measure Help needed turning from your back to your side while in a flat bed without using bedrails?: None Help needed moving from lying on your back to sitting on the side of a flat bed without using bedrails?: None Help needed moving to and from a bed to a chair (including a wheelchair)?: A Little Help needed standing up from a chair using your arms (e.g., wheelchair or bedside chair)?: A Little Help needed to walk in hospital room?: A Little Help needed climbing 3-5 steps with a railing? : A Little 6 Click Score: 20  End of Session Equipment Utilized During Treatment: Gait belt Activity Tolerance: Patient tolerated treatment well;No increased pain Patient left: in chair;with call bell/phone within reach;with family/visitor present Nurse Communication: Mobility status;Other (comment) (pt readiness for d/c from PT perspective) PT Visit Diagnosis: Unsteadiness on feet (R26.81);Other abnormalities of gait and mobility (R26.89);Muscle weakness (generalized) (M62.81);Difficulty in walking, not elsewhere classified (R26.2);Pain Pain - Right/Left: Right Pain - part of body: Leg;Knee    Time: 1219-1258 PT Time Calculation (min) (ACUTE ONLY): 39 min   Charges:   PT Evaluation $PT Eval Low Complexity: 1 Low PT Treatments $Gait Training: 8-22 mins $Therapeutic  Exercise: 8-22 mins PT General Charges $$ ACUTE PT VISIT: 1 Visit         [place], PT Acute Rehab   [place] VEAR Drone 05/01/2024, 1:10 PM

## 2024-05-01 NOTE — Discharge Instructions (Addendum)

## 2024-05-01 NOTE — Op Note (Signed)
 DATE OF SURGERY:  05/01/2024 TIME: 8:50 AM  PATIENT NAME:  Carl [name]   AGE: 68 y.o.    PRE-OPERATIVE DIAGNOSIS:  OA RIGHT KNEE  POST-OPERATIVE DIAGNOSIS:  Same  PROCEDURE:  Procedure(s): ARTHROPLASTY, KNEE, TOTAL   SURGEON:  Evalene JONETTA Chancy, MD   ASSISTANT:  Gerard Large, PA-C, she was present and scrubbed throughout the case, critical for completion in a timely fashion, and for retraction, instrumentation, and closure.    OPERATIVE IMPLANTS: [employer] Triathlon CR. Press fit knee  Femur size 6, Tibia size 6, Patella size 32 3-peg oval button, with a 9 mm polyethylene insert.   PREOPERATIVE INDICATIONS:  [name] is a 68 y.o. year old male with end stage bone on bone degenerative arthritis of the knee who failed conservative treatment, including injections, antiinflammatories, activity modification, and assistive devices, and had significant impairment of their activities of daily living, and elected for Total Knee Arthroplasty.   The risks, benefits, and alternatives were discussed at length including but not limited to the risks of infection, bleeding, nerve injury, stiffness, blood clots, the need for revision surgery, cardiopulmonary complications, among others, and they were willing to proceed.   OPERATIVE DESCRIPTION:  The patient was brought to the operative room and placed in a supine position.  General anesthesia was administered.  IV antibiotics were given.  The lower extremity was prepped and draped in the usual sterile fashion.  Time out was performed.  The leg was elevated and exsanguinated and the tourniquet was inflated.  Anterior approach was performed.  The patella was everted and osteophytes were removed.  The anterior horn of the medial and lateral meniscus was removed.   The distal femur was opened with the drill and the intramedullary distal femoral cutting jig was utilized, set at 5 degrees resecting 10 mm off the distal femur.  Care was taken  to protect the collateral ligaments.  The distal femoral sizing jig was applied, taking care to avoid notching.  Then the 4-in-1 cutting jig was applied and the anterior and posterior femur was cut, along with the chamfer cuts.  All posterior osteophytes were removed.  The flexion gap was then measured and was symmetric with the extension gap.  Then the extramedullary tibial cutting jig was utilized making the appropriate cut using the anterior tibial crest as a reference building in appropriate posterior slope.  Care was taken during the cut to protect the medial and collateral ligaments.  The proximal tibia was removed along with the posterior horns of the menisci.    I completed the distal femoral preparation using the appropriate jig to prepare the box.  The patella was then measured, and cut with the saw.    The proximal tibia sized and prepared accordingly with the reamer and the punch, and then all components were trialed with the above sized poly insert.  The knee was found to have excellent balance and full motion.    The above named components were then impacted into place and Poly tibial piece and patella were inserted.  I was very happy with his stability and ROM  I performed a periarticular injection with Exparel   The knee was easily taken through a range of motion and the patella tracked well and the knee irrigated copiously and the parapatellar and subcutaneous tissue closed with vicryl, and monocryl with steri strips for the skin.  The incision was dressed with sterile gauze and the tourniquet released and the patient was awakened and returned to the  PACU in stable and satisfactory condition.  There were no complications.  Total tourniquet time was roughly 75 minutes.   POSTOPERATIVE PLAN: post op Abx, DVT px: SCD's, TED's, Early ambulation and chemical px

## 2024-05-01 NOTE — Transfer of Care (Signed)
 Immediate Anesthesia Transfer of Care Note  Patient: Carl [name]  Procedure(s) Performed: Procedure(s): ARTHROPLASTY, KNEE, TOTAL (Right)  Patient Location: PACU  Anesthesia Type:MAC, Regional, and Spinal  Level of Consciousness: Patient easily awoken, comfortable, cooperative, following commands, responds to stimulation.   Airway & Oxygen Therapy: Patient spontaneously breathing, ventilating well, oxygen via simple oxygen mask.  Post-op Assessment: Report given to PACU RN, vital signs reviewed and stable, moving all extremities.   Post vital signs: Reviewed and stable.  Complications: No apparent anesthesia complications  Last Vitals:  Vitals Value Taken Time  BP 96/61 05/01/24 09:33  Temp    Pulse 74 05/01/24 09:34  Resp 20 05/01/24 09:34  SpO2 94 % 05/01/24 09:34  Vitals shown include unfiled device data.  Last Pain:  Vitals:   05/01/24 0607  TempSrc:   PainSc: 0-No pain         Complications: No notable events documented.

## 2024-05-01 NOTE — Anesthesia Postprocedure Evaluation (Signed)
 Anesthesia Post Note  Patient: Carl [name]  Procedure(s) Performed: ARTHROPLASTY, KNEE, TOTAL (Right: Knee)     Patient location during evaluation: PACU Anesthesia Type: Spinal Level of consciousness: oriented and awake and alert Pain management: pain level controlled Vital Signs Assessment: post-procedure vital signs reviewed and stable Respiratory status: spontaneous breathing, respiratory function stable and nonlabored ventilation Cardiovascular status: blood pressure returned to baseline and stable Postop Assessment: no headache, no backache, no apparent nausea or vomiting, spinal receding and patient able to bend at knees Anesthetic complications: no   No notable events documented.  Last Vitals:  Vitals:   05/01/24 1015 05/01/24 1030  BP: 112/69 112/73  Pulse: 71 66  Resp: 20 17  Temp:    SpO2: 93% 93%    Last Pain:  Vitals:   05/01/24 1030  TempSrc:   PainSc: 0-No pain                 Akia Montalban A.

## 2024-05-01 NOTE — Progress Notes (Signed)
 Orthopedic Tech Progress Note Patient Details:  CASTOR GITTLEMAN Feb 03, 1956 [id number]  Ortho Devices Type of Ortho Device: Bone foam zero knee Ortho Device/Splint Location: right Ortho Device/Splint Interventions: Ordered, Application, Adjustment   Post Interventions Patient Tolerated: Well Instructions Provided: Adjustment of device, Care of device  Waylan Thom Loving 05/01/2024, 9:58 AM

## 2024-05-01 NOTE — Anesthesia Procedure Notes (Signed)
 Spinal  Patient location during procedure: OR Start time: 05/01/2024 7:30 AM End time: 05/01/2024 7:33 AM Reason for block: surgical anesthesia Staffing Performed: anesthesiologist  Anesthesiologist: Jerrye Sharper, MD Performed by: Jerrye Sharper, MD Authorized by: Jerrye Sharper, MD   Preanesthetic Checklist Completed: patient identified, IV checked, site marked, risks and benefits discussed, surgical consent, monitors and equipment checked, pre-op evaluation and timeout performed Spinal Block Patient position: sitting Prep: DuraPrep and site prepped and draped Patient monitoring: heart rate, cardiac monitor, continuous pulse ox and blood pressure Approach: midline Location: L3-4 Injection technique: single-shot Needle Needle type: Pencan  Needle gauge: 24 G Needle length: 9 cm Needle insertion depth: 7 cm Assessment Sensory level: T4 Events: CSF return Additional Notes Patient tolerated procedure well. Adequate sensory level.

## 2024-05-01 NOTE — Progress Notes (Signed)
 PHARMACY - ANTICOAGULATION CONSULT NOTE  Pharmacy Consult for warfarin Indication: Factor V Leiden, Hx VTE  Allergies  Allergen Reactions   Amoxicillin Other (See Comments)    Joints ache and can't move Has patient had a PCN reaction causing immediate rash, facial/tongue/throat swelling, SOB or lightheadedness with hypotension: No Has patient had a PCN reaction causing severe rash involving mucus membranes or skin necrosis: No Has patient had a PCN reaction that required hospitalization: No Has patient had a PCN reaction occurring within the last 10 years: Yes If all of the above answers are NO, then may proceed with Cephalosporin use.     Other Diarrhea    Alfredo sauce     Patient Measurements: Weight: 97.1 kg (214 lb)  Vital Signs: Temp: 98 F (36.7 C) (08/05 1109) Temp Source: Oral (08/05 1109) BP: 123/90 (08/05 1109) Pulse Rate: 67 (08/05 1109)  Labs: Recent Labs    05/01/24 0600  LABPROT 16.8*  INR 1.3*    Estimated Creatinine Clearance: 99.9 mL/min (by C-G formula based on SCr of 0.68 mg/dL).   Medical History: Past Medical History:  Diagnosis Date   Arthritis    Cancer (HCC)    skin cancer   Carpal tunnel syndrome    Diabetes mellitus without complication (HCC)    Essential hypertension    Factor 5 Leiden mutation, heterozygous (HCC)    GERD (gastroesophageal reflux disease)    History of DVT (deep vein thrombosis) 2020   Left leg   History of kidney stones    Hyperlipidemia    OSA on CPAP    Pre-diabetes     Medications:  Medications Prior to Admission  Medication Sig Dispense Refill Last Dose/Taking   acetaminophen  (TYLENOL ) 500 MG tablet Take 500-1,000 mg by mouth every 6 (six) hours as needed for moderate pain or headache.   Past Week   amLODipine (NORVASC) 10 MG tablet Take 10 mg by mouth in the morning.   05/01/2024 Morning   Ascorbic Acid (VITAMIN C PO) Take 1 tablet by mouth every evening.   Past Week   Cholecalciferol (VITAMIN D) 50  MCG (2000 UT) tablet Take 2,000 Units by mouth at bedtime.   Past Week   enoxaparin (LOVENOX) 150 MG/ML injection Inject 150 mg into the skin daily. Starting 08/01   Past Week   enoxaparin (LOVENOX) 150 MG/ML injection Inject 150 mg into the skin. Once daily starting 04/27/2024-04/30/2024 then resume after surgery   04/30/2024   metoprolol  succinate (TOPROL -XL) 25 MG 24 hr tablet TAKE 1/2 TABLET BY MOUTH EVERY DAY 45 tablet 2 05/01/2024 Morning   oxymetazoline (AFRIN) 0.05 % nasal spray Place 2 sprays into both nostrils at bedtime as needed for congestion.   Taking As Needed   OZEMPIC, 2 MG/DOSE, 8 MG/3ML SOPN Inject 2 mg into the skin every Saturday.   04/21/2024   pantoprazole  (PROTONIX ) 40 MG tablet Take 40 mg by mouth daily before breakfast.   05/01/2024 Morning   rosuvastatin (CRESTOR) 10 MG tablet Take 10 mg by mouth at bedtime.   04/30/2024   traZODone (DESYREL) 100 MG tablet Take 100 mg by mouth at bedtime.   04/30/2024   valsartan (DIOVAN) 160 MG tablet Take 160 mg by mouth in the morning.   04/30/2024   warfarin (COUMADIN ) 3 MG tablet Take 3 mg by mouth every evening. =   04/26/2024 at  8:00 PM   Assessment: 68 yoM on warfarin PTA for history of DVT and Factor V Leiden, admitted 8/5 for knee  arthroplasty. Patient was placed on Lovenox bridge perioperatively, and is now s/p surgery. Pharmacy consulted to resume warfarin postop.  Baseline INR subtherapeutic as expected after holding warfarin since 8/1 Prior anticoagulation:  warfarin 3 mg daily; last dose 7/31 Lovenox 1.5 mg/kg daily; last dose 8/4  Significant events:  Today, 05/01/2024: CBC: pending INR subtherapeutic Major drug interactions: none noted No bleeding issues per nursing No diet ordered; no PO intake charted yet  Goal of Therapy: INR 2-3  Plan: Warfarin 3 mg PO tonight at 16:00 Daily INR CBC at least q72 hr while on warfarin Monitor for signs of bleeding or thrombosis   Bard Jeans, PharmD, BCPS [phone] 05/01/2024,  12:27 PM

## 2024-05-02 ENCOUNTER — Encounter (HOSPITAL_COMMUNITY): Payer: Self-pay | Admitting: Orthopedic Surgery

## 2024-05-02 LAB — HEMOGLOBIN A1C
Hgb A1c MFr Bld: 6.3 % — ABNORMAL HIGH (ref 4.8–5.6)
Mean Plasma Glucose: 134 mg/dL

## 2024-08-13 ENCOUNTER — Ambulatory Visit: Attending: Cardiology | Admitting: Cardiology

## 2024-08-13 ENCOUNTER — Encounter: Payer: Self-pay | Admitting: Cardiology

## 2024-08-13 VITALS — BP 118/70 | HR 82 | Ht 69.0 in | Wt 247.0 lb

## 2024-08-13 DIAGNOSIS — I10 Essential (primary) hypertension: Secondary | ICD-10-CM

## 2024-08-13 DIAGNOSIS — D6851 Activated protein C resistance: Secondary | ICD-10-CM | POA: Diagnosis not present

## 2024-08-13 DIAGNOSIS — E782 Mixed hyperlipidemia: Secondary | ICD-10-CM | POA: Diagnosis not present

## 2024-08-13 DIAGNOSIS — I2510 Atherosclerotic heart disease of native coronary artery without angina pectoris: Secondary | ICD-10-CM

## 2024-08-13 DIAGNOSIS — I82402 Acute embolism and thrombosis of unspecified deep veins of left lower extremity: Secondary | ICD-10-CM | POA: Diagnosis not present

## 2024-08-13 NOTE — Progress Notes (Signed)
    Cardiology Office Note  Date: 08/13/2024   ID: Carl [name], DOB 08/12/1956, MRN [id number]  History of Present Illness: Carl [name] is a 68 y.o. male last seen in January.  He is here for a routine visit.  Reports no angina and stable NYHA class II dyspnea.  He underwent right total knee arthroplasty back in August, no perioperative cardiac events.  He remains on Coumadin  with follow-up by [name].  No major change in remaining medications.  His LDL was 53 in May.  Blood pressure today is well-controlled.  Physical Exam: VS:  BP 118/70 (BP Location: Left Arm, Patient Position: Sitting, Cuff Size: Large)   Pulse 82   Ht 5' 9 (1.753 m)   Wt 247 lb (112 kg)   SpO2 95%   BMI 36.48 kg/m , BMI Body mass index is 36.48 kg/m.  Wt Readings from Last 3 Encounters:  08/13/24 247 lb (112 kg)  05/01/24 214 lb (97.1 kg)  04/19/24 214 lb (97.1 kg)    General: Patient appears comfortable at rest. HEENT: Conjunctiva and lids normal. Neck: Supple, no elevated JVP or carotid bruits. Lungs: Clear to auscultation, nonlabored breathing at rest. Cardiac: Regular rate and rhythm, no S3 or significant systolic murmur. Extremities: No pitting edema.  ECG:  An ECG dated 10/17/2023 was personally reviewed today and demonstrated:  Sinus rhythm with nonspecific ST changes.  Labwork: May 2025: Cholesterol 111, triglycerides 105, HDL 38, LDL 53 04/19/2024: BUN 7; Creatinine, Ser 0.68; Hemoglobin 15.6; Platelets 278; Potassium 3.8; Sodium 137  August 2025: Hemoglobin A1c 6.3%  Other Studies Reviewed Today:  No interval cardiac testing for review today.  Assessment and Plan:  1.  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  Overall moderate disease being managed medically.  No active angina at current level of activity.  Currently on Ozempic 2 mg weekly and Crestor 10 mg daily.  Continue observation.   2.  Factor V Leiden mutation with history of recurrent left leg  DVT, he remains on Coumadin  with follow-up by [name].  No spontaneous bleeding problems reported.   3.  Primary hypertension.  Blood pressure well-controlled today.  Continue Norvasc 10 mg daily, Diovan 180 mg daily, and Toprol -XL 12.5 mg daily.   4.  Mixed hyperlipidemia.  LDL 53 in May.  Continue Crestor 10 mg daily.  Disposition:  Follow up 1 year.  Signed, Jayson JUDITHANN Sierras, M.D., F.A.C.C. [facility]

## 2024-08-13 NOTE — Patient Instructions (Addendum)
